# Patient Record
Sex: Female | Born: 1970 | Race: Black or African American | Hispanic: No | Marital: Single | Smoking: Never smoker
Health system: Southern US, Community
[De-identification: ages and names within clinical notes are randomized; demographics above are authoritative.]

## PROBLEM LIST (undated history)

## (undated) HISTORY — PX: HAND SURGERY: SHX662

## (undated) HISTORY — PX: CHOLECYSTECTOMY: SHX55

---

## 1999-06-02 ENCOUNTER — Inpatient Hospital Stay (HOSPITAL_COMMUNITY): Admission: EM | Admit: 1999-06-02 | Discharge: 1999-06-07 | Payer: Self-pay | Admitting: Emergency Medicine

## 1999-06-02 ENCOUNTER — Encounter (INDEPENDENT_AMBULATORY_CARE_PROVIDER_SITE_OTHER): Payer: Self-pay | Admitting: *Deleted

## 1999-06-02 ENCOUNTER — Encounter: Payer: Self-pay | Admitting: Emergency Medicine

## 1999-06-03 ENCOUNTER — Encounter: Payer: Self-pay | Admitting: Surgery

## 1999-06-04 ENCOUNTER — Encounter: Payer: Self-pay | Admitting: *Deleted

## 1999-06-05 ENCOUNTER — Encounter: Payer: Self-pay | Admitting: Surgery

## 1999-12-25 ENCOUNTER — Emergency Department (HOSPITAL_COMMUNITY): Admission: EM | Admit: 1999-12-25 | Discharge: 1999-12-25 | Payer: Self-pay | Admitting: Emergency Medicine

## 2003-04-01 ENCOUNTER — Emergency Department (HOSPITAL_COMMUNITY): Admission: EM | Admit: 2003-04-01 | Discharge: 2003-04-02 | Payer: Self-pay | Admitting: Emergency Medicine

## 2004-05-05 ENCOUNTER — Emergency Department (HOSPITAL_COMMUNITY): Admission: EM | Admit: 2004-05-05 | Discharge: 2004-05-05 | Payer: Self-pay | Admitting: *Deleted

## 2007-04-27 ENCOUNTER — Emergency Department (HOSPITAL_COMMUNITY): Admission: EM | Admit: 2007-04-27 | Discharge: 2007-04-27 | Payer: Self-pay | Admitting: *Deleted

## 2008-07-22 ENCOUNTER — Emergency Department (HOSPITAL_COMMUNITY): Admission: EM | Admit: 2008-07-22 | Discharge: 2008-07-22 | Payer: Self-pay | Admitting: Emergency Medicine

## 2008-09-23 ENCOUNTER — Emergency Department (HOSPITAL_COMMUNITY): Admission: EM | Admit: 2008-09-23 | Discharge: 2008-09-23 | Payer: Self-pay | Admitting: Emergency Medicine

## 2009-09-22 ENCOUNTER — Emergency Department (HOSPITAL_COMMUNITY): Admission: EM | Admit: 2009-09-22 | Discharge: 2009-09-22 | Payer: Self-pay | Admitting: Family Medicine

## 2009-11-16 ENCOUNTER — Emergency Department (HOSPITAL_COMMUNITY): Admission: EM | Admit: 2009-11-16 | Discharge: 2009-11-16 | Payer: Self-pay | Admitting: Emergency Medicine

## 2010-04-05 ENCOUNTER — Emergency Department (HOSPITAL_COMMUNITY)
Admission: EM | Admit: 2010-04-05 | Discharge: 2010-04-05 | Payer: Self-pay | Source: Home / Self Care | Admitting: Family Medicine

## 2010-07-30 LAB — BASIC METABOLIC PANEL
Chloride: 107 mEq/L (ref 96–112)
Creatinine, Ser: 0.71 mg/dL (ref 0.4–1.2)
GFR calc Af Amer: >60 mL/min (ref >60)
Sodium: 138 mEq/L (ref 135–145)

## 2010-07-30 LAB — CBC
Platelets: 299 10*3/uL (ref 150–400)
RDW: 13.7 % (ref 11.5–15.5)
WBC: 7.9 10*3/uL (ref 4.0–10.5)

## 2010-07-30 LAB — DIFFERENTIAL
Basophils Absolute: 0.1 10*3/uL (ref 0.0–0.1)
Eosinophils Relative: 1 % (ref 0–5)
Lymphocytes Relative: 18 % (ref 12–46)
Neutro Abs: 5.7 10*3/uL (ref 1.7–7.7)
Neutrophils Relative %: 73 % (ref 43–77)

## 2010-09-07 NOTE — Discharge Summary (Signed)
[place]. [facility]  Patient:    Lisa Woodward, Lisa Woodward                 MRN: [id number] Adm. Date:  [id number] Disc. Date: [id number] Attending:  Bonnetta Barry                           Discharge Summary  NO DICTATION DD:  08/13/99 TD:  08/14/99 Job: [id number]

## 2010-09-07 NOTE — Op Note (Signed)
[place]. [facility]  Patient:    Lisa Woodward, Lisa Woodward                 MRN: [id number] Proc. Date: 06/03/99 Adm. Date:  [id number] Attending:  Bonnetta Barry CC:         Llana Aliment. Randa Evens, M.D.             Florencia Reasons, M.D.                           Operative Report  PREOPERATIVE DIAGNOSIS:  Chronic cholecystitis with cholelithiasis.  POSTOPERATIVE DIAGNOSIS:  Chronic cholecystitis with cholelithiasis, retained common bile duct stone, perihepatic adhesions (Fitz-Hugh-Curtis syndrome).  OPERATION PERFORMED:  Laparoscopic cholecystectomy, intraoperative cholangiogram, exploration of common bile duct with Fogarty balloon catheter, lysis of perihepatic adhesions.  SURGEON:  Angelia Mould. Derrell Lolling, M.D.  FIRST ASSISTANT:  Thornton Park. Daphine Deutscher, M.D.  ANESTHESIA:  OPERATIVE INDICATIONS:  This is a 40 year old black female who has a long history of intermittent episodes of postprandial right upper quadrant pain and nausea. She was admitted to the hospital last night with an episode of severe epigastric pain. Ultrasound suggested a normal common bile duct.  Liver function tests showed elevation of SGOT and SGPT, but normal alkaline phosphatase and normal bilirubin. This morning, she was minimally symptomatic and her liver function tests were somewhat better, although not normal.  She was perioperatively by Florencia Reasons, M.D., who felt that she was at relatively low risks for common bile duct stone, and recommended that we proceed with cholecystectomy.  She is brought to the operating room for that surgery.  OPERATIVE FINDINGS:  The patient had chronically inflamed, thick walled gallbladder containing gallstones.  The cystic duct and common bile duct where, in fact, dilated.  Cholangiogram showed a retained _________ stone in the distal common bile duct, about 8-10 mm in diameter.  This could not be removed with the balloon catheter.  The  liver itself looked normal, although were extensive perihepatic adhesions from the dome of the liver to the diaphragm, suggesting prior inflammatory process.  The stomach and duodenum appeared healthy.  The small intestine and large intestine were grossly normal.  DESCRIPTION OF PROCEDURE:  Following the induction of general endotracheal anesthesia, the patients abdomen was prepped and draped in a sterile fashion.  transverse incision was made just below the umbilicus.  The fascia was incised n the midline and the abdominal cavity entered under direct vision. Pneumoperitoneum was created.  Video camera was inserted with visualization and findings as described above.  A 10 mm trocar was placed in the subxiphoid region, and two 5 mm trocars placed in the right mid abdomen.  We examined in the area around the umbilical trocar, and found that there were some adhesions tinting one loop of small bowel to the umbilicus.  This adhesion was taken down and we carefully inspected the small bowel, and found no evidence of injury.  We then turned our attention to the gallbladder and the liver.  We took down all of the perihepatic adhesions, and took photographs of this.  The gallbladder was placed on traction.  There were lots of adhesions to the duodenum to the gallbladder, and these were taken down very carefully.  We dissected out the very large cystic duct and very clearly identified the common bile duct.  The cystic  duct was secured with a metal clip and opened, and we inserted a balloon cholangiogram  catheter.  Cholangiogram was obtained and showed that there was an 8-10 mm _________ stone of the distal common bile duct, but no obstruction. The contrast did flow into the duodenum.  We felt no other filling defects.  The intrahepatic and extrahepatic bile ducts looked normal.  We removed the cholangiogram catheter and took a #5-French biliary Fogarty catheter and ran that down into the  duodenum several times, and withdrew this through the common bile  duct.  We felt that we removed the common bile duct stone, but we could not pull this out into the cystic duct.  We did not have the choledochoscope or the _____ available to [place], and so we did not feel that we should open the common bile duct laparoscopically.  We took a cholangiogram after running the biliary Fogarty, and it looked like the stone was up in the common hepatic duct.  We discussed the case with Fayrene Fearing L. Edwards, M.D. over the phone, and we all felt that it would be best to perform ERCP tomorrow morning to remove this common bile duct stone.  We then secured to the cystic duct with several metal clips and divided it.  We  isolated the anterior branch and the posterior branch of the cystic artery, secured these with metal clips and divided this.  The gallbladder was then dissected from its bed with electrocautery and removed through the umbilical port.  The operative field was copiously irrigated.  At the completion of the case, there was no bleeding and no bile leak whatsoever.  The trocars were removed under direct vision and there was no bleeding.  The pneumoperitoneum was released.  The fascia at the umbilicus was closed with 0 Vicryl sutures.  The skin incisions were closed with subcuticular stitches of 4-0 Vicryl and Steri-Strips.  Clean bandages were placed and the patient taken to the recovery room in stable  condition.  Estimated blood loss was about 10-15 cc.  Complications none. Sponge, needle, and instrument counts were correct. DD:  06/03/99 TD:  06/04/99 Job: [id number]

## 2010-09-07 NOTE — Discharge Summary (Signed)
[place]. [facility]  Patient:    Lisa Woodward, Lisa Woodward                 MRN: [id number] Adm. Date:  [id number] Disc. Date: [id number] Attending:  Bonnetta Barry CC:         Llana Aliment. Randa Evens, M.D.             Roosvelt Harps, M.D.                           Discharge Summary  FINAL DIAGNOSES: 1. Chronic cholecystitis with cholelithiasis. 2. Choledocholithiasis. 3. Perihepatic adhesions (Fitz-Hugh-Curtis syndrome).  OPERATIONS PERFORMED: 1. Laparoscopic cholecystectomy with intraoperative cholangiogram.  Fogarty balloon    catheter exploration of common bile duct, lysis of perihepatic    adhesions,    June 03, 1999. 2. Endoscopic retrograde cholangiopancreatography with sphincterotomy and    common bile duct stone removal.  HISTORY OF PRESENT ILLNESS:  This is a 40 year old black female who presented to the emergency department with a 24-hour history of epigastric pain, nausea, vomiting, and chills. She was evaluated by the emergency department physician. She was found to have a white blood cell count of 10,000 with a left shift and liver function tests showing an elevated SGOT of 738, elevated SGPT of 595, and a normal total bilirubin of 1.1.  Ultrasound was obtained which confirmed gallstones but no thickening of the gallbladder wall and no ductal dilatation.  General surgery was consulted and Dr. Darnell Level admitted her to the hospital.  PHYSICAL EXAMINATION:  GENERAL:  A 40 year old black female in minimal discomfort on a stretcher.  VITAL SIGNS:  Temperature 98.7, pulse 73, respirations 18, blood pressure 125/73.  HEENT:  Sclerae nonicteric.  CHEST:  Clear to auscultation.  HEART:  Regular rate and rhythm, no wheeze, no CVA tenderness.  ABDOMEN:  Soft.  Bowel sounds present, mild tenderness in the epigastrium. Mild Murphys sign present.  No palpable mass, no guarding, no rebound.  HOSPITAL COURSE:  Patient was admitted by Dr.  Darnell Level.  She was started on antibiotics and analgesics.  She was also seen by Dr. Carman Ching to discuss whether to do preoperative or postoperative ERCP.  Dr. Randa Evens and Dr. Bernette Redbird both felt that the patient should go ahead and have a laparoscopic cholecystectomy and they will stand by for ERCP postoperatively.  She was taken to the operating room on February 11.  I found that she had a chronically inflamed gallbladder and extensive adhesions of the liver to the diaphragm.  We took all of her perihepatic adhesions down, performed cholecystectomy and a cholangiogram.  She had what looked like a distal common ile duct stone.  We retrieved this with a balloon catheter unsuccessfully.  Postoperatively, she underwent ERCP and sphincterotomy and successful removal of common bile duct stones by Dr. Roosvelt Harps on February 12.  She did reasonably well post procedure but she had some fever and some nausea, nd some diarrhea for the next couple of days but that eventually resolved.  Her liver function tests got much better.  She resumed a diet and activity.  She was discharged on June 10, 1999.  At that time, she was afebrile, tolerating a diet, feeling well, and wanting to go home.  Her wounds were healing uneventfully.  She was asked to return to see me in the office in three weeks. DD:  07/20/99 TD:  07/21/99 Job: [id number]

## 2010-09-07 NOTE — H&P (Signed)
. [facility]  Patient:    Lisa Woodward, Lisa Woodward                 MRN: [id number] Adm. Date:  [id number] Attending:  Lorre Nick CC:         Osvaldo Human, M.D.             Velora Heckler, M.D.             James L. Randa Evens, M.D.                         History and Physical  REFERRING PHYSICIAN:  Osvaldo Human, M.D.  PRIMARY CARE PHYSICIAN:  [facility]  REASON FOR ADMISSION:  Abdominal pain, nausea, vomiting, liver function tests abnormalities.  HISTORY OF PRESENT ILLNESS:  The patient is a 40 year old black female who presents to the emergency department with 24-hour history of epigastric abdominal pain associated with nausea, vomiting, and chills.  The patient was evaluated in the  emergency department.  She was noted to have a white blood cell count in the upper range of normal at 10.0 with 83% segmented neutrophils.  The patient had liver function tests which showed an elevated SGOT of 738, an elevated SGPT of 595, with a normal total bilirubin of 1.1.  Right upper quadrant ultrasound was obtained,  which confirmed gallstones without acute inflammatory changes or ductal dilatation. General surgery is consulted at this time for further evaluation and management.  PAST MEDICAL HISTORY:  History of right hand surgery.  No other medical problems.  MEDICATIONS:  BC Powder, Pepcid AC, and Motrin over-the-counter for abdominal pain.  ALLERGIES:  None known.  SOCIAL HISTORY:  The patient does not smoke tobacco.  She uses marijuana on a regular basis.  She drinks alcohol in moderate amounts on a regular basis.  REVIEW OF SYSTEMS:  The patient notes intermittent abdominal pain, lasting for approximately one hour, occurring over the past year.  Otherwise systems review is negative.  She denies jaundice.  She denies any previous hepatobiliary disease.  She denies any history of hepatitis or pancreatitis.  She denies any  acholic stools.  PHYSICAL EXAMINATION:  GENERAL:  [age]-year-old thin black female in minimal discomfort on a stretcher in the emergency department.  VITAL SIGNS:  Temperature 98.7, pulse 73, respirations 18, blood pressure 125/73, O2 saturation is 98% on room air.  HEENT:  Normocephalic.  Sclerae are clear.  Mucous membranes are moist.  NECK:  Supple.  Without masses.  CHEST:  Clear to auscultation bilaterally.  CARDIAC:  Regular rate and rhythm with respiratory variation.  There is no costovertebral angle tenderness.  ABDOMEN:  Soft.  There are bowel sounds present.  There is mild tenderness in the epigastrium to palpation.  There is a mild Murphys sign present with deep inspiration.  There are no palpable masses.  There is no guarding.  There is no  rebound tenderness.  EXTREMITIES:  Nontender, without edema.  NEUROLOGIC:  Alert and oriented to person, place, and time, without focal neurologic deficit.  LABORATORY DATA:  Complete blood count shows a white count 10.0, hemoglobin 12.5, hematocrit 36.0%, platelet count 301,000.  Differential shows 83% neutrophils, 2% lymphocytes, 3% monocytes, 1% eosinophils.  Chemistry profile was notable for the following abnormalities:  SGOT 738, SGPT 595, total bilirubin of 1.1.  Alkaline  phosphatase was normal at 96, lipase was normal at 20.  Urinalysis is essentially benign except for a  slightly elevated urobilinogen of 4.0 mg/dl.  Radiographic studies:  Ultrasound of the abdomen documenting gallstones without  acute inflammatory changes or biliary dilatation.  IMPRESSION: 1. Cholelithiasis. 2. Rule out choledocholithiasis versus acute hepatitis. 3. Polysubstance abuse.  PLAN: 1. Admission to [facility]. 2. Intravenous hydrations. 3. Repeat laboratory studies. 4. Consultation with Dr. Carman Ching from gastroenterology for further    evaluation. DD:  06/02/99 TD:  06/03/99 Job: [id number]

## 2011-01-10 LAB — POCT PREGNANCY, URINE
Operator id: 146091
Preg Test, Ur: NEGATIVE

## 2011-01-10 LAB — COMPREHENSIVE METABOLIC PANEL
BUN: 7
CO2: 26
Chloride: 105
Creatinine, Ser: 0.75
GFR calc non Af Amer: >60
Total Bilirubin: 0.8

## 2011-01-10 LAB — URINALYSIS, ROUTINE W REFLEX MICROSCOPIC
Bilirubin Urine: NEGATIVE
Ketones, ur: NEGATIVE
Nitrite: NEGATIVE
Specific Gravity, Urine: 1.024
Urobilinogen, UA: 0.2

## 2011-01-10 LAB — URINE MICROSCOPIC-ADD ON

## 2011-01-10 LAB — CBC
HCT: 38.4
MCV: 95.3
RBC: 4.03
WBC: 7.9

## 2011-01-10 LAB — DIFFERENTIAL
Basophils Absolute: 0
Lymphocytes Relative: 25
Neutro Abs: 5.3

## 2011-01-10 LAB — LIPASE, BLOOD: Lipase: 17

## 2011-07-20 ENCOUNTER — Emergency Department (HOSPITAL_COMMUNITY)
Admission: EM | Admit: 2011-07-20 | Discharge: 2011-07-20 | Disposition: A | Discharge status: Home / Self Care | Payer: 59 | Source: Home / Self Care | Attending: Family Medicine | Admitting: Family Medicine

## 2011-07-20 ENCOUNTER — Encounter (HOSPITAL_COMMUNITY): Payer: Self-pay | Admitting: *Deleted

## 2011-07-20 DIAGNOSIS — R0789 Other chest pain: Secondary | ICD-10-CM

## 2011-07-20 DIAGNOSIS — K0889 Other specified disorders of teeth and supporting structures: Secondary | ICD-10-CM

## 2011-07-20 MED ORDER — AMOXICILLIN 500 MG PO CAPS: 500.0000 mg | ORAL_CAPSULE | Freq: Three times a day (TID) | ORAL | Status: AC

## 2011-07-20 MED ORDER — DICLOFENAC POTASSIUM 50 MG PO TABS: 50.0000 mg | ORAL_TABLET | Freq: Three times a day (TID) | ORAL | Status: AC

## 2011-07-20 NOTE — ED Provider Notes (Signed)
History     CSN: [id number]  Arrival date & time 07/20/11  1356   First MD Initiated Contact with Patient 07/20/11 1453      Chief Complaint  Patient presents with  . Dental Pain  . Nausea  . Chest Pain    (Consider location/radiation/quality/duration/timing/severity/associated sxs/prior treatment) Patient is a 41 y.o. female presenting with tooth pain and chest pain. The history is provided by the patient.  Dental PainThe primary symptoms include mouth pain. Primary symptoms do not include sore throat. The symptoms began more than 1 week ago. The symptoms are worsening. The symptoms occur constantly.  Additional symptoms include: dental sensitivity to temperature and gum swelling.   Chest Pain     History reviewed. No pertinent past medical history.  Past Surgical History  Procedure Date  . Cholecystectomy     History reviewed. No pertinent family history.  History  Substance Use Topics  . Smoking status: Never Smoker   . Smokeless tobacco: Not on file  . Alcohol Use: No    OB History    Grav Para Term Preterm Abortions TAB SAB Ect Mult Living                  Review of Systems  Constitutional: Negative.   HENT: Positive for dental problem. Negative for sore throat.   Cardiovascular: Positive for chest pain.    Allergies  Review of patient's allergies indicates no known allergies.  Home Medications   Current Outpatient Rx  Name Route Sig Dispense Refill  . BC HEADACHE POWDER PO Oral Take by mouth.    . AMOXICILLIN 500 MG PO CAPS Oral Take 1 capsule (500 mg total) by mouth 3 (three) times daily. 30 capsule 0  . DICLOFENAC POTASSIUM 50 MG PO TABS Oral Take 1 tablet (50 mg total) by mouth 3 (three) times daily. 30 tablet 0    BP 151/90  Pulse 74  Temp(Src) 98.6 F (37 C) (Oral)  Resp 18  SpO2 99%  LMP 07/06/2011  Physical Exam  Nursing note and vitals reviewed. Constitutional: She appears well-developed and well-nourished.  HENT:  Head:  Normocephalic.  Right Ear: External ear normal.  Left Ear: External ear normal.  Mouth/Throat:    Neck: Normal range of motion. Neck supple.  Cardiovascular: Normal rate, regular rhythm and normal heart sounds.   Pulmonary/Chest: Breath sounds normal. She exhibits tenderness.    ED Course  Procedures (including critical care time)  Labs Reviewed - No data to display No results found.   1. Pain, dental   2. Musculoskeletal chest pain       MDM          Linna Hoff, MD 07/20/11 1600

## 2011-07-20 NOTE — ED Notes (Signed)
Pt with c/o left lower tooth ache x 2 weeks - onset of intermittent pain left side chest two days after toothache - denies pain at this time chest area

## 2012-01-08 ENCOUNTER — Encounter (HOSPITAL_COMMUNITY): Payer: Self-pay | Admitting: *Deleted

## 2012-01-08 ENCOUNTER — Emergency Department (HOSPITAL_COMMUNITY)
Admission: EM | Admit: 2012-01-08 | Discharge: 2012-01-08 | Disposition: A | Discharge status: Home / Self Care | Payer: 59 | Source: Home / Self Care | Attending: Emergency Medicine | Admitting: Emergency Medicine

## 2012-01-08 DIAGNOSIS — R110 Nausea: Secondary | ICD-10-CM

## 2012-01-08 LAB — POCT URINALYSIS DIP (DEVICE)
Bilirubin Urine: NEGATIVE
Ketones, ur: NEGATIVE mg/dL
Leukocytes, UA: NEGATIVE
Nitrite: NEGATIVE
Protein, ur: NEGATIVE mg/dL

## 2012-01-08 LAB — POCT I-STAT, CHEM 8
Glucose, Bld: 89 mg/dL (ref 70–99)
HCT: 42 % (ref 36.0–46.0)
Hemoglobin: 14.3 g/dL (ref 12.0–15.0)
Potassium: 3.7 mEq/L (ref 3.5–5.1)
Sodium: 142 mEq/L (ref 135–145)

## 2012-01-08 MED ORDER — ONDANSETRON HCL 4 MG PO TABS: 4.0000 mg | ORAL_TABLET | Freq: Four times a day (QID) | ORAL | Status: DC

## 2012-01-08 NOTE — ED Provider Notes (Addendum)
History     CSN: [id number]  Arrival date & time 01/08/12  1805   First MD Initiated Contact with Patient 01/08/12 1822      Chief Complaint  Patient presents with  . Nausea    (Consider location/radiation/quality/duration/timing/severity/associated sxs/prior treatment) HPI Comments: Patient describes a for about a week she's been experiencing intermittent episodes of nausea, hot flashes at night and sometimes discomfort in her chest (patient points to the substernal region). Denies any, shortness of breath, palpitations. Upon evaluation patient seemed to be feeling okay he is asymptomatic denies any chest pain its swollen. No abdominal pain does feel some distention. Patient denies any other constitutional symptoms such as fevers, generalized malaise changes in appetite or unintentional weight loss.  Patient is a 41 y.o. female presenting with cough and chest pain. The history is provided by the patient.  Cough This is a recurrent problem. The problem has been gradually improving. The cough is non-productive. Associated symptoms include chest pain, chills and sweats. Pertinent negatives include no weight loss, no headaches, no sore throat, no myalgias, no shortness of breath and no wheezing. She is not a smoker. Her past medical history does not include bronchitis, pneumonia, COPD, emphysema or asthma.  Chest Pain Chest pain occurs intermittently. The chest pain is unchanged. At its most intense, the pain is at 4/10. The pain is currently at 0/10. The quality of the pain is described as brief. Chest pain is worsened by deep breathing. Primary symptoms include nausea. Pertinent negatives for primary symptoms include no fever, no fatigue, no shortness of breath, no cough, no wheezing, no palpitations, no abdominal pain, no vomiting and no dizziness.  Associated symptoms include diaphoresis.  Pertinent negatives for associated symptoms include no numbness.  Pertinent negatives for past medical  history include no seizures.     History reviewed. No pertinent past medical history.  Past Surgical History  Procedure Date  . Cholecystectomy     Family History  Problem Relation Age of Onset  . Family history unknown: Yes    History  Substance Use Topics  . Smoking status: Never Smoker   . Smokeless tobacco: Not on file  . Alcohol Use: No    OB History    Grav Para Term Preterm Abortions TAB SAB Ect Mult Living                  Review of Systems  Constitutional: Positive for chills and diaphoresis. Negative for fever, weight loss, activity change, appetite change, fatigue and unexpected weight change.  HENT: Negative for sore throat.   Respiratory: Negative for apnea, cough, chest tightness, shortness of breath and wheezing.   Cardiovascular: Positive for chest pain. Negative for palpitations.  Gastrointestinal: Positive for nausea and diarrhea. Negative for vomiting, abdominal pain, constipation, blood in stool, abdominal distention, anal bleeding and rectal pain.  Genitourinary: Negative for dysuria, frequency, enuresis and pelvic pain.  Musculoskeletal: Negative for myalgias, back pain, joint swelling, arthralgias and gait problem.  Skin: Negative for pallor, rash and wound.  Neurological: Negative for dizziness, seizures, speech difficulty, numbness and headaches.    Allergies  Review of patient's allergies indicates no known allergies.  Home Medications   Current Outpatient Rx  Name Route Sig Dispense Refill  . BC HEADACHE POWDER PO Oral Take by mouth.    . DICLOFENAC POTASSIUM 50 MG PO TABS Oral Take 1 tablet (50 mg total) by mouth 3 (three) times daily. 30 tablet 0  . ONDANSETRON HCL 4 MG PO TABS  Oral Take 1 tablet (4 mg total) by mouth every 6 (six) hours. 12 tablet 0    BP 141/95  Pulse 63  Temp 98.6 F (37 C) (Oral)  Resp 18  SpO2 99%  LMP 01/01/2012  Physical Exam  Nursing note and vitals reviewed. Constitutional: Vital signs are normal.  She appears well-developed and well-nourished.  Non-toxic appearance. She does not have a sickly appearance. She does not appear ill. No distress.  HENT:  Head: Normocephalic.  Eyes: Conjunctivae normal are normal.  Neck: Neck supple.  Cardiovascular: Normal rate.  Exam reveals no gallop and no friction rub.   No murmur heard. Abdominal: Soft. Normal appearance. She exhibits no distension and no mass. There is no hepatosplenomegaly or hepatomegaly. There is no tenderness. There is no rigidity, no rebound, no guarding, no CVA tenderness, no tenderness at McBurney's point and negative Murphy's sign.  Musculoskeletal: She exhibits no tenderness.  Neurological: She is alert.  Skin: No rash noted. No erythema.    ED Course  Procedures (including critical care time)   Labs Reviewed  POCT I-STAT, CHEM 8  POCT URINALYSIS DIP (DEVICE)  TSH   No results found.   1. Nausea     EKG sinus bradycardia to switch her from 55-60 beats per minute. No interval QRS abnormality. No signs of ischemia  MDM  Patient presents urgent care at this point asymptomatic describing symptomatology that includes nausea diarrhea. Discussed with patient her lab results and pending TSH. Patient had a normal cardiovascular respiratory and abdominal exam. Have discussed with her today will be important to followup with primary care Dr. as I have found no etiology for her symptoms and have advised her that there many illnesses or disorders can present in a similar fashion with baked symptomatology such as this. Patient agrees with followup care with primary care Dr. to continue the diagnostic workup. If her symptoms were to persist. We also discussed what symptoms will require further evaluation in the emergency department.        Jimmie Molly, MD 01/08/12 Barry Brunner  Jimmie Molly, MD 01/08/12 1610  Jimmie Molly, MD 01/08/12 2026

## 2012-01-08 NOTE — ED Notes (Signed)
Pt reports nausea, hot flashes at night, and chest soreness on and off for the past week with intermittent diarrhea

## 2013-07-08 ENCOUNTER — Emergency Department (HOSPITAL_COMMUNITY)
Admission: EM | Admit: 2013-07-08 | Discharge: 2013-07-08 | Disposition: A | Discharge status: Home / Self Care | Payer: 59 | Attending: Emergency Medicine | Admitting: Emergency Medicine

## 2013-07-08 ENCOUNTER — Encounter (HOSPITAL_COMMUNITY): Payer: Self-pay | Admitting: Emergency Medicine

## 2013-07-08 DIAGNOSIS — M25561 Pain in right knee: Secondary | ICD-10-CM

## 2013-07-08 DIAGNOSIS — Z79899 Other long term (current) drug therapy: Secondary | ICD-10-CM | POA: Insufficient documentation

## 2013-07-08 DIAGNOSIS — M25569 Pain in unspecified knee: Secondary | ICD-10-CM | POA: Insufficient documentation

## 2013-07-08 DIAGNOSIS — J029 Acute pharyngitis, unspecified: Secondary | ICD-10-CM | POA: Insufficient documentation

## 2013-07-08 DIAGNOSIS — J3489 Other specified disorders of nose and nasal sinuses: Secondary | ICD-10-CM | POA: Insufficient documentation

## 2013-07-08 MED ORDER — IBUPROFEN 800 MG PO TABS: 800.0000 mg | ORAL_TABLET | Freq: Three times a day (TID) | ORAL | Status: DC

## 2013-07-08 NOTE — ED Provider Notes (Signed)
CSN: [id number]     Arrival date & time 07/08/13  1304 History  This chart was scribed for non-physician practitioner, Sharilyn Sites, PA-C working with Juliet Rude. Rubin Payor, MD by Greggory Stallion, ED scribe. This patient was seen in room TR10C/TR10C and the patient's care was started at 2:13 PM.   Chief Complaint  Patient presents with  . Sore Throat   The history is provided by the patient. No language interpreter was used.   HPI Comments: Lisa Woodward is a 43 y.o. female who presents to the Emergency Department complaining of gradual onset, constant sore throat that started 4 days ago. Pt has associated sneezing and mild nasal congestion.  Hx of allergy problems but not currently on any meds.  No cough, fevers, or chills.  Grandson recently sick with URI.  Pt also complains of right knee pain x 3 days.  No injury, trauma, or falls.  States pain is worse in the morning and improves throughout the day.  Described as a generalized ache.  No prior knee injuries or surgeries.  Ambulating without difficulty.  No meds taken PTA.   History reviewed. No pertinent past medical history. Past Surgical History  Procedure Laterality Date  . Cholecystectomy     No family history on file. History  Substance Use Topics  . Smoking status: Never Smoker   . Smokeless tobacco: Not on file  . Alcohol Use: No   OB History   Grav Para Term Preterm Abortions TAB SAB Ect Mult Living                 Review of Systems  HENT: Positive for sore throat. Negative for sneezing.   Respiratory: Negative for cough.   Musculoskeletal: Positive for arthralgias.  All other systems reviewed and are negative.   Allergies  Review of patient's allergies indicates no known allergies.  Home Medications   Current Outpatient Rx  Name  Route  Sig  Dispense  Refill  . Aspirin-Salicylamide-Caffeine (BC HEADACHE POWDER PO)   Oral   Take by mouth.         . ondansetron (ZOFRAN) 4 MG tablet   Oral   Take 1  tablet (4 mg total) by mouth every 6 (six) hours.   12 tablet   0    BP 133/80  Pulse 88  Temp(Src) 98.1 F (36.7 C)  Resp 16  SpO2 100%  Physical Exam  Nursing note and vitals reviewed. Constitutional: She is oriented to person, place, and time. She appears well-developed and well-nourished. No distress.  HENT:  Head: Normocephalic and atraumatic.  Right Ear: Tympanic membrane and ear canal normal.  Left Ear: Tympanic membrane and ear canal normal.  Nose: Nose normal. Right sinus exhibits no maxillary sinus tenderness and no frontal sinus tenderness. Left sinus exhibits no maxillary sinus tenderness and no frontal sinus tenderness.  Mouth/Throat: Uvula is midline, oropharynx is clear and moist and mucous membranes are normal. No oropharyngeal exudate, posterior oropharyngeal edema, posterior oropharyngeal erythema or tonsillar abscesses.  Tonsils normal in appearance bilaterally without exudate, uvula midline, no peritonsillar abscess, handling secretions appropriately without difficulty  Eyes: Conjunctivae and EOM are normal. Pupils are equal, round, and reactive to light.  Neck: Normal range of motion.  Cardiovascular: Normal rate, regular rhythm and normal heart sounds.   Pulmonary/Chest: Effort normal and breath sounds normal. No respiratory distress. She has no wheezes.  Musculoskeletal: Normal range of motion. She exhibits no edema.  Right knee non tender. No gross deformities or  effusion. Crepitus with flexion and extension. Distal sensation intact. Ambulating unassisted without difficulty.  Neurological: She is alert and oriented to person, place, and time.  Skin: Skin is warm and dry. She is not diaphoretic.  Psychiatric: She has a normal mood and affect.    ED Course  Procedures (including critical care time)  DIAGNOSTIC STUDIES: Oxygen Saturation is 100% on RA, normal by my interpretation.    COORDINATION OF CARE: 2:16 PM-Discussed treatment plan which includes  allergy medicine and an anti-inflammatory with pt at bedside and pt agreed to plan.   Labs Review Labs Reviewed - No data to display Imaging Review No results found.   EKG Interpretation None      MDM   Final diagnoses:  Allergic pharyngitis  Knee pain, right   Symptoms and physical exam findings consistent with allergic pharyngitis. Patient instructed to start taking over-the-counter allergy medicine. Right knee pain without gross deformity or known injury. On exam she does have crepitus with flexion and extension, likely osteoarthritis. Patient will be started on anti-inflammatories. She does not have a current PCP at this time, I have encouraged her to establish care with one area, resource guide given. Discussed plan with patient, she acknowledged understanding and agreed with plan of care.  I personally performed the services described in this documentation, which was scribed in my presence. The recorded information has been reviewed and is accurate  Garlon HatchetLisa M Sanders, PA-C 07/08/13 1529

## 2013-07-08 NOTE — Discharge Instructions (Signed)
Take the prescribed medication as directed. Recommend start taking over the counter allergy medicine such as zyrtec, claritin, allegra, etc to help control allergy sx. Follow-up with a primary care physician in the area.  Resource guide attached to help with this. Return to the ED for new or worsening symptoms.   Emergency Department Resource Guide 1) Find a Doctor and Pay Out of Pocket Although you won't have to find out who is covered by your insurance plan, it is a good idea to ask around and get recommendations. You will then need to call the office and see if the doctor you have chosen will accept you as a new patient and what types of options they offer for patients who are self-pay. Some doctors offer discounts or will set up payment plans for their patients who do not have insurance, but you will need to ask so you aren't surprised when you get to your appointment.  2) Contact Your Local Health Department Not all health departments have doctors that can see patients for sick visits, but many do, so it is worth a call to see if yours does. If you don't know where your local health department is, you can check in your phone book. The CDC also has a tool to help you locate your state's health department, and many state websites also have listings of all of their local health departments.  3) Find a Walk-in Clinic If your illness is not likely to be very severe or complicated, you may want to try a walk in clinic. These are popping up all over the country in pharmacies, drugstores, and shopping centers. They're usually staffed by nurse practitioners or physician assistants that have been trained to treat common illnesses and complaints. They're usually fairly quick and inexpensive. However, if you have serious medical issues or chronic medical problems, these are probably not your best option.  No Primary Care Doctor: - Call Health Connect at  [phone] - they can help you locate a primary care  doctor that  accepts your insurance, provides certain services, etc. - Physician Referral Service- [phone]  Chronic Pain Problems: Organization         Address  Phone   Notes  [facility]  [phone] Patients need to be referred by their primary care doctor.   Medication Assistance: Organization         Address  Phone   Notes  [facility] Medication [facility] [address] [place], [place] [address] [phone] --Must be a resident of [place] -- Must have NO insurance coverage whatsoever (no Medicaid/ Medicare, etc.) -- The pt. MUST have a primary care doctor that directs their care regularly and follows them in the community   MedAssist  [phone]   [employer]  [phone]    Agencies that provide inexpensive medical care: Organization         Address  Phone   Notes  [facility]  [phone]   [facility]    [phone]   [facility] [address] [place], [place] [address] [phone]   [facility] [address], [place] [phone]   [facility]anned [facility]    [phone]   [facility]    [phone]   [facility] and [facility]  [address], [place] Phone:  [phone], Fax:  [phone] Hours of Operation:  9 am - 6 pm, M-F.  Also accepts Medicaid/Medicare and self-pay.  [facility] [place], [address], [place] Phone: [phone], Fax: [phone]. Hours of Operation:  8:30 am - 5:30 pm, M-F.  Also accepts Medicaid and self-pay.  [facility] [place] [address] [place] Phone: [phone]   [place], [place], [place] [phone], Ext. [phone] Mondays & Thursdays: 7-9 AM.  First 15 patients are seen on a first come, first serve basis.    [place]  Providers:  Organization         Address  Phone   Notes  [facility] [address], [place] [phone] Also accepts self-pay patients.  [facility] [address], [place]  [phone]   [place], [address], [place] [phone]   [facility] [address] [place] [phone]   Lucianne Lei [address], [place]   [phone] Only accepts [place] Access [place] patients after they have their name applied to their card.   Self-Pay (no insurance) in [facility]:  Organization         Address  Phone   Notes  Sickle Cell Patients, [facility] [place] [phone]   [facility] [place] [phone]   [facility]  [place], [place],  [phone]   [facility]/Dr. Osei-Bonsu  [address] [place] or [address], [place] [phone] Phone number for both [place] and [place] locations is the same.  [facility] [address], [place] [phone]   [facility] [address] [place] or [address] [phone]   [facility] [address], [place] [phone], phone; [phone], fax Sees patients 1st and 3rd Saturday of every month.  Must not qualify for public or private insurance (i.e. Medicaid, Medicare, [place] Health Choice, Veterans' Benefits)  Household income should be no more than 200% of the poverty level The clinic cannot treat you if you are pregnant or think you are pregnant  Sexually transmitted diseases are not treated at the clinic.    Dental Care: Organization         Address  Phone  Notes  [facility] Department of [facility] [place] [phone] Accepts children up to age 12 who are enrolled in [place] or [place]; pregnant women with a Medicaid card; and children who have applied for Medicaid or [place] Health Choice, but were declined, whose parents can pay a reduced fee at time of service.  [facility] Department of [facility]  [address], [place] [phone] Accepts children up to age 24 who are enrolled in [place] or [place]; pregnant women with a Medicaid card; and children who have applied for Medicaid or [place] Health Choice, but were declined, whose parents can pay a reduced fee at time of service.  [facility] Adult [facility] Access PROGRAM  [place] [phone] Patients are seen by appointment only. Walk-ins are not accepted. [place] will see patients 18 years  of age and older. Monday - Tuesday (8am-5pm) Most Wednesdays (8:30-5pm) $30 per visit, cash only  [facility] Adult Dental Access PROGRAM  [address], [facility] [phone] Patients are seen by appointment only. Walk-ins are not accepted. [place] will see patients 43 years of age and older. One Wednesday Evening (Monthly: Volunteer Based).  $30 per visit, cash only  [place]  [phone] for adults; Children under age 81, call Graduate Pediatric Dentistry at [phone]. Children aged 70-14, please call [phone] to request a pediatric application.  Dental services are provided in all areas of dental care including fillings, crowns and bridges, complete and partial dentures, implants, gum treatment, root canals, and extractions. Preventive care is also provided. Treatment is provided to both adults and children. Patients are selected via a lottery and there is often a waiting list.   [facility] [address], [place]  [phone] [email or web address]   [facility]  [address] [place], [place] [phone], Ext. [phone] Second and Fourth Thursday of each month, opens at 6:30 AM; Clinic ends at 9 AM.  Patients are seen on a first-come first-served basis, and a limited number are seen during each clinic.   [facility]  [address] Hillard Danker [place], [place] [phone]   Eligibility Requirements You must have lived in [place], [place], or [place] counties for at least the last three months.   You cannot be eligible for state or federal sponsored [employer], including [employer], [place], or [employer].   You generally cannot be eligible for healthcare insurance through your employer.    How to apply: Eligibility screenings are held every Tuesday and Wednesday afternoon from 1:00 pm until 4:00 pm. You do not need an appointment for the interview!  [facility] [address], [place], [place]   [place]  [place] Department  [place]  [phone]    Behavioral Health Resources in the Community: Intensive Outpatient Programs Organization         Address  Phone  Notes  [place]. [address] [place], [place] [phone]   [facility] [address] [place], [place]   ADS: Alcohol & Drug Svcs [address] [place], [place]   [place] [address],  [place], [place] or [phone]   Substance Abuse Resources Organization         Address  Phone  Notes  Alcohol and Drug Services  [phone]   [place]  [phone]   The [place]   Chinita Pester  [phone]   Residential & Outpatient Substance Abuse Program  [phone]   Psychological Services Organization         Address  Phone  Notes  [facility] [place]  [place]  [phone]   [place] [address] [place] or [phone]    Mobile Crisis Teams Organization         Address  Phone  Notes  [facility], Mobile Crisis Care Unit  [phone]   [facility]  [address] [place], [place]   [facility] [address] [place] [phone]    Self-Help/Support Groups Organization  Address  Phone             Notes  [place]. of [place] - variety of support groups  [place] Call for more information  Narcotics Anonymous (NA), [facility]aring [facility] [address], [employer] [place]  2 meetings at this location   Special educational needs teacher         Address  Phone  Notes  [facility] Residential Treatment [place],    [place]  [phone]   [facility]  [address], [place] 329924, [place], [place]   [place], [place] [phone] Admissions: 8am-3pm M-F  Incentives Substance [place] [address],    [place], [place] [phone]   [facility] [address] [place], [place], [place]   [facility] [address],  [place], [place]   [facility] Programs - Intensive Outpatient [address], [place], [place]   [facility].,  [place], [place] [phone] or [phone]   [facility] Services (RTS) [address] [place], [place] Accepts Medicaid  [facility] [address]  [place] [phone] Substance Abuse/Addiction Treatment   [facility] Organization         Address  Phone  Notes  [facility]  [phone]   Domenic Schwab, PhD [address] Arlis Porta [place], [place]   [phone] or [phone]    [place], [place] [phone]   [facility] [address], [place], [place] [phone] Insurance/Medicaid/sponsorship through [facility] and Families [address], [address] [place]                                    [place], [place] [phone] [place] [address], [place] [phone]    Dr. Adele Schilder  [phone]   [facility] Dept. 1) [address], [place] 2) [place] 3)  [place] [address], [place] [phone]  [phone]   [place] [phone] or [phone] (After Hours)

## 2013-07-08 NOTE — ED Notes (Signed)
Sore throat, since Sunday  rt leg pain ( denies injury)  Also since sunday

## 2013-07-12 NOTE — ED Provider Notes (Signed)
Medical screening examination/treatment/procedure(s) were performed by non-physician practitioner and as supervising physician I was immediately available for consultation/collaboration.   EKG Interpretation None       Juliet RudeNathan R. Rubin PayorPickering, MD 07/12/13 [phone]

## 2014-07-04 ENCOUNTER — Emergency Department (INDEPENDENT_AMBULATORY_CARE_PROVIDER_SITE_OTHER)
Admission: EM | Admit: 2014-07-04 | Discharge: 2014-07-04 | Disposition: A | Discharge status: Home / Self Care | Payer: Self-pay | Source: Home / Self Care | Attending: Family Medicine | Admitting: Family Medicine

## 2014-07-04 ENCOUNTER — Encounter (HOSPITAL_COMMUNITY): Payer: Self-pay | Admitting: Emergency Medicine

## 2014-07-04 DIAGNOSIS — G44209 Tension-type headache, unspecified, not intractable: Secondary | ICD-10-CM

## 2014-07-04 DIAGNOSIS — J029 Acute pharyngitis, unspecified: Secondary | ICD-10-CM

## 2014-07-04 DIAGNOSIS — Z720 Tobacco use: Secondary | ICD-10-CM

## 2014-07-04 DIAGNOSIS — F17200 Nicotine dependence, unspecified, uncomplicated: Secondary | ICD-10-CM

## 2014-07-04 DIAGNOSIS — J9801 Acute bronchospasm: Secondary | ICD-10-CM

## 2014-07-04 DIAGNOSIS — B349 Viral infection, unspecified: Secondary | ICD-10-CM

## 2014-07-04 LAB — POCT RAPID STREP A: Streptococcus, Group A Screen (Direct): NEGATIVE

## 2014-07-04 MED ORDER — DICLOFENAC POTASSIUM 50 MG PO TABS: 50.0000 mg | ORAL_TABLET | Freq: Three times a day (TID) | ORAL | Status: DC

## 2014-07-04 MED ORDER — ALBUTEROL SULFATE HFA 108 (90 BASE) MCG/ACT IN AERS: 2.0000 | INHALATION_SPRAY | RESPIRATORY_TRACT | Status: DC | PRN

## 2014-07-04 NOTE — Discharge Instructions (Signed)
Pharyngitis Pharyngitis is a sore throat (pharynx). There is redness, pain, and swelling of your throat. HOME CARE   Drink enough fluids to keep your pee (urine) clear or pale yellow.  Only take medicine as told by your doctor.  You may get sick again if you do not take medicine as told. Finish your medicines, even if you start to feel better.  Do not take aspirin.  Rest.  Rinse your mouth (gargle) with salt water ( tsp of salt per 1 qt of water) every 1-2 hours. This will help the pain.  If you are not at risk for choking, you can suck on hard candy or sore throat lozenges. GET HELP IF:  You have large, tender lumps on your neck.  You have a rash.  You cough up green, yellow-brown, or bloody spit. GET HELP RIGHT AWAY IF:   You have a stiff neck.  You drool or cannot swallow liquids.  You throw up (vomit) or are not able to keep medicine or liquids down.  You have very bad pain that does not go away with medicine.  You have problems breathing (not from a stuffy nose). MAKE SURE YOU:   Understand these instructions.  Will watch your condition.  Will get help right away if you are not doing well or get worse. Document Released: 09/25/2007 Document Revised: 01/27/2013 Document Reviewed: 12/14/2012 [facility] Patient Information 2015 [place], [place]. This information is not intended to replace advice given to you by your health care provider. Make sure you discuss any questions you have with your health care provider.  Viral Infections Continue the NyQuil. And start taking the [place] 3 times a day with food. Drink lots of fluids. Apply heat to the back of the neck. Cepacol lozenges for sore throat Stop smoking A viral infection can be caused by different types of viruses.Most viral infections are not serious and resolve on their own. However, some infections may cause severe symptoms and may lead to further complications. SYMPTOMS Viruses can frequently  cause:  Minor sore throat.  Aches and pains.  Headaches.  Runny nose.  Different types of rashes.  Watery eyes.  Tiredness.  Cough.  Loss of appetite.  Gastrointestinal infections, resulting in nausea, vomiting, and diarrhea. These symptoms do not respond to antibiotics because the infection is not caused by bacteria. However, you might catch a bacterial infection following the viral infection. This is sometimes called a "superinfection." Symptoms of such a bacterial infection may include:  Worsening sore throat with pus and difficulty swallowing.  Swollen neck glands.  Chills and a high or persistent fever.  Severe headache.  Tenderness over the sinuses.  Persistent overall ill feeling (malaise), muscle aches, and tiredness (fatigue).  Persistent cough.  Yellow, green, or brown mucus production with coughing. HOME CARE INSTRUCTIONS   Only take over-the-counter or prescription medicines for pain, discomfort, diarrhea, or fever as directed by your caregiver.  Drink enough water and fluids to keep your urine clear or pale yellow. Sports drinks can provide valuable electrolytes, sugars, and hydration.  Get plenty of rest and maintain proper nutrition. Soups and broths with crackers or rice are fine. SEEK IMMEDIATE MEDICAL CARE IF:   You have severe headaches, shortness of breath, chest pain, neck pain, or an unusual rash.  You have uncontrolled vomiting, diarrhea, or you are unable to keep down fluids.  You or your child has an oral temperature above 102 F (38.9 C), not controlled by medicine.  Your baby is older than  3 months with a rectal temperature of 102 F (38.9 C) or higher.  Your baby is 493 months old or younger with a rectal temperature of 100.4 F (38 C) or higher. MAKE SURE YOU:   Understand these instructions.  Will watch your condition.  Will get help right away if you are not doing well or get worse. Document Released: 01/16/2005 Document  Revised: 07/01/2011 Document Reviewed: 08/13/2010 [facility] Patient Information 2015 [place]. This information is not intended to replace advice given to you by your health care provider. Make sure you discuss any questions you have with your health care provider.

## 2014-07-04 NOTE — ED Notes (Signed)
Pt states that she has had a headache and sore throat since 06/30/2014

## 2014-07-04 NOTE — ED Provider Notes (Signed)
CSN: [id number]     Arrival date & time 07/04/14  0802 History   First MD Initiated Contact with Patient 07/04/14 [phone]     Chief Complaint  Patient presents with  . Headache  . Sore Throat   (Consider location/radiation/quality/duration/timing/severity/associated sxs/prior Treatment) HPI Comments: 44 year old females complaining of sore throat, headache tickly in the posterior neck and occiput, eyes hurt, cough and PND. Symptoms began about 4 days ago. Denies fever occasionally has chills. She is a smoker. She has been taking NyQuil and other OTC cold medicines.   History reviewed. No pertinent past medical history. Past Surgical History  Procedure Laterality Date  . Cholecystectomy     History reviewed. No pertinent family history. History  Substance Use Topics  . Smoking status: Never Smoker   . Smokeless tobacco: Not on file  . Alcohol Use: No   OB History    No data available     Review of Systems  Constitutional: Positive for activity change. Negative for fever, chills, appetite change and fatigue.  HENT: Positive for congestion, postnasal drip and sore throat. Negative for facial swelling and rhinorrhea.   Eyes: Negative.   Respiratory: Negative.   Cardiovascular: Negative.   Gastrointestinal: Negative.   Genitourinary: Negative.   Musculoskeletal: Negative for neck pain and neck stiffness.  Skin: Negative for pallor and rash.  Neurological: Negative.     Allergies  Review of patient's allergies indicates no known allergies.  Home Medications   Prior to Admission medications   Medication Sig Start Date End Date Taking? Authorizing Provider  albuterol (PROVENTIL HFA;VENTOLIN HFA) 108 (90 BASE) MCG/ACT inhaler Inhale 2 puffs into the lungs every 4 (four) hours as needed for wheezing or shortness of breath. 07/04/14   Hayden Rasmussenavid Chaise Mahabir, NP  Aspirin-Salicylamide-Caffeine (BC HEADACHE POWDER PO) Take 1 packet by mouth daily as needed (pain).     Historical Provider, MD   bismuth subsalicylate (PEPTO BISMOL) 262 MG/15ML suspension Take 30 mLs by mouth every 6 (six) hours as needed for indigestion.    Historical Provider, MD  diclofenac (CATAFLAM) 50 MG tablet Take 1 tablet (50 mg total) by mouth 3 (three) times daily. One tablet TID with food prn pain. 07/04/14   Hayden Rasmussenavid Alyx Gee, NP  ibuprofen (ADVIL,MOTRIN) 800 MG tablet Take 1 tablet (800 mg total) by mouth 3 (three) times daily. 07/08/13   Garlon HatchetLisa M Sanders, PA-C  Omega-3 Fatty Acids (FISH OIL PO) Take 1 capsule by mouth daily.    Historical Provider, MD   BP 137/91 mmHg  Pulse 66  Temp(Src) 98.1 F (36.7 C)  Resp 16  SpO2 100% Physical Exam  Constitutional: She is oriented to person, place, and time. She appears well-developed and well-nourished. No distress.  HENT:  Bilateral TMs are normal Oropharynx with minor erythema, no swelling or exudates.  Eyes: EOM are normal.  Neck: Normal range of motion. Neck supple.  Cardiovascular: Normal rate, regular rhythm and normal heart sounds.   Pulmonary/Chest:  Mildly prolonged expiratory phase, rare wheeze bilaterally and coarseness with forced cough.  Musculoskeletal: Normal range of motion. She exhibits no edema.  Lymphadenopathy:    She has no cervical adenopathy.  Neurological: She is alert and oriented to person, place, and time.  Skin: Skin is warm and dry.  Psychiatric: She has a normal mood and affect.  Nursing note and vitals reviewed.   ED Course  Procedures (including critical care time) Labs Review Labs Reviewed  POCT RAPID STREP A ([facility] ONLY)    Imaging  Review No results found. Results for orders placed or performed during the hospital encounter of 07/04/14  POCT rapid strep A [facility])  Result Value Ref Range   Streptococcus, Group A Screen (Direct) NEGATIVE NEGATIVE     MDM   1. Viral illness   2. Tension headache   3. Pharyngitis   4. Bronchospasm   5. Current smoker    Continue the NyQuil. And start taking the  Cataflam 3 times a day with food. Drink lots of fluids. Apply heat to the back of the neck. Cepacol lozenges for sore throat Stop smoking Albuterol HFA as dir    Hayden Rasmussen, NP 07/04/14 [phone]

## 2014-07-06 LAB — CULTURE, GROUP A STREP: STREP A CULTURE: NEGATIVE

## 2015-11-02 ENCOUNTER — Encounter (HOSPITAL_COMMUNITY): Payer: Self-pay | Admitting: Emergency Medicine

## 2015-11-02 ENCOUNTER — Ambulatory Visit (HOSPITAL_COMMUNITY)
Admission: EM | Admit: 2015-11-02 | Discharge: 2015-11-02 | Disposition: A | Discharge status: Home / Self Care | Payer: Self-pay | Attending: Family Medicine | Admitting: Family Medicine

## 2015-11-02 DIAGNOSIS — M79601 Pain in right arm: Secondary | ICD-10-CM

## 2015-11-02 DIAGNOSIS — R109 Unspecified abdominal pain: Secondary | ICD-10-CM

## 2015-11-02 DIAGNOSIS — H6501 Acute serous otitis media, right ear: Secondary | ICD-10-CM

## 2015-11-02 DIAGNOSIS — M5441 Lumbago with sciatica, right side: Secondary | ICD-10-CM

## 2015-11-02 LAB — COMPREHENSIVE METABOLIC PANEL
ALT: 39 U/L (ref 14–54)
AST: 28 U/L (ref 15–41)
Albumin: 4 g/dL (ref 3.5–5.0)
Alkaline Phosphatase: 56 U/L (ref 38–126)
Anion gap: 6 (ref 5–15)
BUN: 7 mg/dL (ref 6–20)
CO2: 27 mmol/L (ref 22–32)
Calcium: 9.4 mg/dL (ref 8.9–10.3)
Chloride: 102 mmol/L (ref 101–111)
Creatinine, Ser: 0.8 mg/dL (ref 0.44–1.00)
GFR calc Af Amer: >60 mL/min (ref >60)
GFR calc non Af Amer: >60 mL/min (ref >60)
Glucose, Bld: 106 mg/dL — ABNORMAL HIGH (ref 65–99)
Potassium: 3.7 mmol/L (ref 3.5–5.1)
Sodium: 135 mmol/L (ref 135–145)
Total Bilirubin: 0.9 mg/dL (ref 0.3–1.2)
Total Protein: 7.1 g/dL (ref 6.5–8.1)

## 2015-11-02 LAB — CBC WITH DIFFERENTIAL/PLATELET
Basophils Absolute: 0 10*3/uL (ref 0.0–0.1)
Basophils Relative: 0 %
Eosinophils Absolute: 0.2 10*3/uL (ref 0.0–0.7)
Eosinophils Relative: 4 %
HCT: 39.4 % (ref 36.0–46.0)
Hemoglobin: 12.4 g/dL (ref 12.0–15.0)
Lymphocytes Relative: 34 %
Lymphs Abs: 1.9 10*3/uL (ref 0.7–4.0)
MCH: 29.7 pg (ref 26.0–34.0)
MCHC: 31.5 g/dL (ref 30.0–36.0)
MCV: 94.5 fL (ref 78.0–100.0)
Monocytes Absolute: 0.2 10*3/uL (ref 0.1–1.0)
Monocytes Relative: 4 %
Neutro Abs: 3.3 10*3/uL (ref 1.7–7.7)
Neutrophils Relative %: 58 %
Platelets: 290 10*3/uL (ref 150–400)
RBC: 4.17 MIL/uL (ref 3.87–5.11)
RDW: 13.7 % (ref 11.5–15.5)
WBC: 5.6 10*3/uL (ref 4.0–10.5)

## 2015-11-02 MED ORDER — DICLOFENAC POTASSIUM 50 MG PO TABS
50.0000 mg | ORAL_TABLET | Freq: Three times a day (TID) | ORAL | Status: DC
Start: 2015-11-02 — End: 2017-09-08

## 2015-11-02 MED ORDER — PREDNISONE 20 MG PO TABS: ORAL_TABLET | ORAL | Status: DC

## 2015-11-02 MED ORDER — AMOXICILLIN-POT CLAVULANATE 875-125 MG PO TABS: 1.0000 | ORAL_TABLET | Freq: Two times a day (BID) | ORAL | Status: DC

## 2015-11-02 MED ORDER — TRAMADOL HCL 50 MG PO TABS: 50.0000 mg | ORAL_TABLET | Freq: Four times a day (QID) | ORAL | Status: DC | PRN

## 2015-11-02 NOTE — ED Notes (Signed)
The patient presented to the [facility] with a complaint of pain in her right eye and ear as well as a pain in her right buttock that extends to her knee. The patient stated that the pain has been ongoing for 2 weeks.

## 2015-11-02 NOTE — Discharge Instructions (Signed)

## 2015-11-02 NOTE — ED Provider Notes (Signed)
CSN: [id number]     Arrival date & time 11/02/15  1640 History   First MD Initiated Contact with Patient 11/02/15 1732     Chief Complaint  Patient presents with  . Otalgia  . Extremity Pain   (Consider location/radiation/quality/duration/timing/severity/associated sxs/prior Treatment) HPI  Lisa Woodward is a 45 y.o. female presenting to UC with c/o Right ear and eye pain, Right arm pain, and Right lower back pain that goes into her Right thigh.  Symptoms have been ongoing for about 2 weeks. Ibuprofen does help. Reports mild nasal congestion and cough for about 2 weeks as well. Pain is aching and sore. Denies fever, chills, n/v/d.  Denies falls or known injuries.  Pt also notes toward end of exam she gets "night sweats" when lying on her abdomen. Hx of abdominal hernia repair a few years ago. Denies abdominal pain at this time but concerned for discomfort when lying on abdomen.  She does not have a PCP.   History reviewed. No pertinent past medical history. Past Surgical History  Procedure Laterality Date  . Cholecystectomy     History reviewed. No pertinent family history. Social History  Substance Use Topics  . Smoking status: Never Smoker   . Smokeless tobacco: None  . Alcohol Use: No   OB History    No data available     Review of Systems  Constitutional: Negative for fever and chills.  HENT: Positive for congestion, ear pain (Right) and rhinorrhea. Negative for sinus pressure, sore throat, trouble swallowing and voice change.   Respiratory: Positive for cough. Negative for shortness of breath.   Cardiovascular: Negative for chest pain and palpitations.  Gastrointestinal: Positive for abdominal pain ( "discomfort"). Negative for nausea, vomiting and diarrhea.  Genitourinary: Negative for dysuria, frequency and flank pain.  Musculoskeletal: Positive for myalgias and back pain. Negative for arthralgias, neck pain and neck stiffness.  Skin: Negative for rash.   Neurological: Positive for headaches. Negative for dizziness and light-headedness.    Allergies  Review of patient's allergies indicates no known allergies.  Home Medications   Prior to Admission medications   Medication Sig Start Date End Date Taking? Authorizing Provider  ibuprofen (ADVIL,MOTRIN) 800 MG tablet Take 1 tablet (800 mg total) by mouth 3 (three) times daily. 07/08/13  Yes Garlon HatchetLisa M Sanders, PA-C  albuterol (PROVENTIL HFA;VENTOLIN HFA) 108 (90 BASE) MCG/ACT inhaler Inhale 2 puffs into the lungs every 4 (four) hours as needed for wheezing or shortness of breath. 07/04/14   Hayden Rasmussenavid Mabe, NP  amoxicillin-clavulanate (AUGMENTIN) 875-125 MG tablet Take 1 tablet by mouth 2 (two) times daily. One po bid x 7 days 11/02/15   Junius FinnerErin O'Malley, PA-C  Aspirin-Salicylamide-Caffeine [facility](BC HEADACHE POWDER PO) Take 1 packet by mouth daily as needed (pain).     Historical Provider, MD  bismuth subsalicylate (PEPTO BISMOL) 262 MG/15ML suspension Take 30 mLs by mouth every 6 (six) hours as needed for indigestion.    Historical Provider, MD  diclofenac (CATAFLAM) 50 MG tablet Take 1 tablet (50 mg total) by mouth 3 (three) times daily. One tablet TID with food prn pain. 11/02/15   Junius FinnerErin O'Malley, PA-C  Omega-3 Fatty Acids (FISH OIL PO) Take 1 capsule by mouth daily.    Historical Provider, MD  predniSONE (DELTASONE) 20 MG tablet 3 tabs po day one, then 2 po daily x 4 days 11/02/15   Junius FinnerErin O'Malley, PA-C  traMADol (ULTRAM) 50 MG tablet Take 1 tablet (50 mg total) by mouth every 6 (six) hours as  needed. 11/02/15   Junius Finner, PA-C   Meds Ordered and Administered this Visit  Medications - No data to display  BP 138/89 mmHg  Pulse 69  Temp(Src) 98.3 F (36.8 C) (Oral)  Resp 18  SpO2 100%  LMP 01/01/2012 No data found.   Physical Exam  Constitutional: She appears well-developed and well-nourished. No distress.  HENT:  Head: Normocephalic and atraumatic.  Right Ear: Tympanic membrane is erythematous and  bulging.  Left Ear: Tympanic membrane normal.  Nose: Nose normal.  Mouth/Throat: Uvula is midline, oropharynx is clear and moist and mucous membranes are normal.  Eyes: Conjunctivae are normal. No scleral icterus.  Neck: Normal range of motion. Neck supple.  No midline spinal tenderness. Mild tenderness to to Right side cervical muscles.   Cardiovascular: Normal rate, regular rhythm and normal heart sounds.   Pulmonary/Chest: Effort normal and breath sounds normal. No stridor. No respiratory distress. She has no wheezes. She has no rales.  Abdominal: Soft. Bowel sounds are normal. She exhibits no distension and no mass. There is no tenderness. There is no rebound and no guarding.  Soft, non-distended, non-tender.  Musculoskeletal: Normal range of motion. She exhibits tenderness. She exhibits no edema.  No midline spinal tenderness. Tenderness to Right lower lumbar muscles and Right lateral thigh muscles. Negative straight leg raise.  Lymphadenopathy:    She has no cervical adenopathy.  Neurological: She is alert.  Skin: Skin is warm and dry. No rash noted. She is not diaphoretic. No erythema.  Nursing note and vitals reviewed.   ED Course  Procedures (including critical care time)  Labs Review Labs Reviewed  CBC WITH DIFFERENTIAL/PLATELET  COMPREHENSIVE METABOLIC PANEL    Imaging Review No results found.    MDM   1. Right acute serous otitis media, recurrence not specified   2. Right arm pain   3. Right-sided low back pain with right-sided sciatica   4. Abdominal discomfort    Exam c/w Right AOM and Right sided sciatica. Pt also c/o abdominal discomfort, only when lying on abdomen at night. Pt does not have PCP. Will check abdominal labs, however, low concern for surgical abdomen at this time.  Rx: Augmentin, prednisone, tramadol, and diclofenac.  Encouraged f/u with PCP in 1 week if not improving, sooner if worsening. Patient verbalized understanding and agreement with  treatment plan.     Junius Finner, PA-C 11/02/15 1840

## 2015-11-10 NOTE — ED Notes (Signed)
Spoke  With patient  About    Pharmacy  About rx   Pharmacist  Called  Pt  And  Fixed  The   Situation

## 2017-03-08 ENCOUNTER — Other Ambulatory Visit: Payer: Self-pay

## 2017-03-08 ENCOUNTER — Encounter (HOSPITAL_COMMUNITY): Payer: Self-pay | Admitting: Emergency Medicine

## 2017-03-08 ENCOUNTER — Emergency Department (HOSPITAL_COMMUNITY)
Admission: EM | Admit: 2017-03-08 | Discharge: 2017-03-08 | Disposition: A | Discharge status: Home / Self Care | Payer: Self-pay | Attending: Emergency Medicine | Admitting: Emergency Medicine

## 2017-03-08 DIAGNOSIS — Z79899 Other long term (current) drug therapy: Secondary | ICD-10-CM | POA: Insufficient documentation

## 2017-03-08 DIAGNOSIS — J069 Acute upper respiratory infection, unspecified: Secondary | ICD-10-CM | POA: Insufficient documentation

## 2017-03-08 DIAGNOSIS — R519 Headache, unspecified: Secondary | ICD-10-CM

## 2017-03-08 DIAGNOSIS — R51 Headache: Secondary | ICD-10-CM

## 2017-03-08 DIAGNOSIS — R0981 Nasal congestion: Secondary | ICD-10-CM

## 2017-03-08 LAB — URINALYSIS, ROUTINE W REFLEX MICROSCOPIC
Bilirubin Urine: NEGATIVE
Glucose, UA: NEGATIVE mg/dL
HGB URINE DIPSTICK: NEGATIVE
Ketones, ur: NEGATIVE mg/dL
Leukocytes, UA: NEGATIVE
Nitrite: NEGATIVE
PH: 6 (ref 5.0–8.0)
Protein, ur: NEGATIVE mg/dL
Specific Gravity, Urine: 1.016 (ref 1.005–1.030)

## 2017-03-08 LAB — COMPREHENSIVE METABOLIC PANEL
ALT: 34 U/L (ref 14–54)
AST: 25 U/L (ref 15–41)
Albumin: 3.9 g/dL (ref 3.5–5.0)
Alkaline Phosphatase: 62 U/L (ref 38–126)
Anion gap: 9 (ref 5–15)
BUN: 9 mg/dL (ref 6–20)
CHLORIDE: 104 mmol/L (ref 101–111)
CO2: 25 mmol/L (ref 22–32)
Calcium: 9.4 mg/dL (ref 8.9–10.3)
Creatinine, Ser: 0.88 mg/dL (ref 0.44–1.00)
GFR calc Af Amer: >60 mL/min (ref >60)
GFR calc non Af Amer: >60 mL/min (ref >60)
GLUCOSE: 170 mg/dL — AB (ref 65–99)
Potassium: 3.8 mmol/L (ref 3.5–5.1)
Sodium: 138 mmol/L (ref 135–145)
Total Bilirubin: 0.9 mg/dL (ref 0.3–1.2)
Total Protein: 7.2 g/dL (ref 6.5–8.1)

## 2017-03-08 LAB — LIPASE, BLOOD: LIPASE: 21 U/L (ref 11–51)

## 2017-03-08 LAB — CBC
HCT: 40.7 % (ref 36.0–46.0)
Hemoglobin: 13.1 g/dL (ref 12.0–15.0)
MCH: 30.7 pg (ref 26.0–34.0)
MCHC: 32.2 g/dL (ref 30.0–36.0)
MCV: 95.3 fL (ref 78.0–100.0)
Platelets: 244 10*3/uL (ref 150–400)
RBC: 4.27 MIL/uL (ref 3.87–5.11)
RDW: 13.5 % (ref 11.5–15.5)
WBC: 6.2 10*3/uL (ref 4.0–10.5)

## 2017-03-08 NOTE — ED Notes (Signed)
Mercedes PA at bedsdie

## 2017-03-08 NOTE — ED Triage Notes (Signed)
Pt. Stated, I started yesterday having stomach ache and back pain.  Denies any other symptoms

## 2017-03-08 NOTE — Discharge Instructions (Signed)
Continue to stay well-hydrated. Gargle warm salt water and spit it out and Use chloraseptic spray as needed for sore throat. Continue to alternate between Tylenol and Ibuprofen for pain or fever. Use Mucinex for cough suppression/expectoration of mucus. Use netipot and flonase to help with nasal congestion. May consider over-the-counter Benadryl or other antihistamine to decrease secretions and for help with your symptoms. Follow up with your primary care doctor in 5-7 days for recheck of ongoing symptoms; if you don't have a regular doctor then [facility] for recheck of symptoms and to establish medical care. Return to emergency department for emergent changing or worsening of symptoms.

## 2017-03-08 NOTE — ED Provider Notes (Signed)
[facility] Provider Note   CSN: [id number] Arrival date & time: 03/08/17  [id number]     History   Chief Complaint Chief Complaint  Patient presents with  . Abdominal Pain  . Back Pain    HPI Lisa Woodward is a 46 y.o. female with a PSHx of cholecystectomy, who presents to the ED with complaints of sinus congestion with clear drainage, frontal headache, nausea, chills, clogged sensation in her right ear, and dry cough times 2 days.  She describes the headache as 8/10 intermittent throbbing nonradiating frontal headache with no known aggravating or alleviating factors, no treatments tried prior to arrival.  She denies any fevers, ear drainage, vision changes, lightheadedness, sore throat, CV, S OB, abdominal pain, vomiting, diarrhea, constipation, dysuria, hematuria, numbness, tingling, focal weakness, or any other complaints at this time.  She denies any recent travel or sick contacts.  She is a non-smoker.   The history is provided by the patient and medical records. No language interpreter was used.  Headache   This is a new problem. The current episode started 2 days ago. Episode frequency: intermittent. The problem has not changed since onset.Associated with: sinus congestion. The pain is located in the frontal region. The quality of the pain is described as throbbing. The pain is at a severity of 8/10. The pain is moderate. The pain does not radiate. Associated symptoms include nausea. Pertinent negatives include no fever, no shortness of breath and no vomiting. She has tried nothing for the symptoms. The treatment provided no relief.    History reviewed. No pertinent past medical history.  There are no active problems to display for this patient.   Past Surgical History:  Procedure Laterality Date  . CHOLECYSTECTOMY      OB History    No data available       Home Medications    Prior to Admission medications   Medication Sig  Start Date End Date Taking? Authorizing Provider  albuterol (PROVENTIL HFA;VENTOLIN HFA) 108 (90 BASE) MCG/ACT inhaler Inhale 2 puffs into the lungs every 4 (four) hours as needed for wheezing or shortness of breath. 07/04/14   Hayden RasmussenMabe, David, NP  amoxicillin-clavulanate (AUGMENTIN) 875-125 MG tablet Take 1 tablet by mouth 2 (two) times daily. One po bid x 7 days 11/02/15   Lurene ShadowPhelps, Erin O, PA-C  Aspirin-Salicylamide-Caffeine [facility](BC HEADACHE POWDER PO) Take 1 packet by mouth daily as needed (pain).     [provider]  bismuth subsalicylate (PEPTO BISMOL) 262 MG/15ML suspension Take 30 mLs by mouth every 6 (six) hours as needed for indigestion.    [provider]  diclofenac (CATAFLAM) 50 MG tablet Take 1 tablet (50 mg total) by mouth 3 (three) times daily. One tablet TID with food prn pain. 11/02/15   Lurene ShadowPhelps, Erin O, PA-C  ibuprofen (ADVIL,MOTRIN) 800 MG tablet Take 1 tablet (800 mg total) by mouth 3 (three) times daily. 07/08/13   Garlon HatchetSanders, Lisa M, PA-C  Omega-3 Fatty Acids (FISH OIL PO) Take 1 capsule by mouth daily.    [provider]  predniSONE (DELTASONE) 20 MG tablet 3 tabs po day one, then 2 po daily x 4 days 11/02/15   Lurene ShadowPhelps, Erin O, PA-C  traMADol (ULTRAM) 50 MG tablet Take 1 tablet (50 mg total) by mouth every 6 (six) hours as needed. 11/02/15   Lurene ShadowPhelps, Erin O, PA-C    Family History No family history on file.  Social History Social History   Tobacco Use  .  Smoking status: Never Smoker  . Smokeless tobacco: Never Used  Substance Use Topics  . Alcohol use: No  . Drug use: No     Allergies   Patient has no known allergies.   Review of Systems Review of Systems  Constitutional: Positive for chills. Negative for fever.  HENT: Positive for congestion, ear pain (clogged feeling) and rhinorrhea. Negative for ear discharge and sore throat.   Eyes: Negative for visual disturbance.  Respiratory: Positive for cough. Negative for shortness of breath.     Cardiovascular: Negative for chest pain.  Gastrointestinal: Positive for nausea. Negative for abdominal pain, constipation, diarrhea and vomiting.  Genitourinary: Negative for dysuria and hematuria.  Musculoskeletal: Negative for arthralgias and myalgias.  Skin: Negative for color change.  Allergic/Immunologic: Negative for immunocompromised state.  Neurological: Positive for headaches. Negative for weakness, light-headedness and numbness.  Psychiatric/Behavioral: Negative for confusion.   All other systems reviewed and are negative for acute change except as noted in the HPI.    Physical Exam Updated Vital Signs BP (!) 156/93 (BP Location: Right Arm)   Pulse 61   Temp 97.8 F (36.6 C) (Oral)   Resp 16   Ht 6\' 1"  (1.854 m)   Wt 77.1 kg (170 lb)   LMP 01/01/2012   SpO2 100%   BMI 22.43 kg/m   Physical Exam  Constitutional: She is oriented to person, place, and time. Vital signs are normal. She appears well-developed and well-nourished.  Non-toxic appearance. No distress.  Afebrile, nontoxic, NAD  HENT:  Head: Normocephalic and atraumatic.  Right Ear: Hearing, tympanic membrane, external ear and ear canal normal.  Left Ear: Hearing, tympanic membrane, external ear and ear canal normal.  Nose: Mucosal edema and rhinorrhea present. Right sinus exhibits maxillary sinus tenderness and frontal sinus tenderness. Left sinus exhibits no maxillary sinus tenderness and no frontal sinus tenderness.  Mouth/Throat: Uvula is midline, oropharynx is clear and moist and mucous membranes are normal. No trismus in the jaw. No uvula swelling. Tonsils are 0 on the right. Tonsils are 0 on the left. No tonsillar exudate.  Ears are clear bilaterally. Nose congested with scant rhinorrhea, mild sinus TTP on right frontal and maxillary sinuses. Oropharynx clear and moist, without uvular swelling or deviation, no trismus or drooling, no tonsillar swelling or erythema, no exudates.    Eyes: Conjunctivae and  EOM are normal. Pupils are equal, round, and reactive to light. Right eye exhibits no discharge. Left eye exhibits no discharge.  PERRL, EOMI, no nystagmus, no visual field deficits   Neck: Normal range of motion. Neck supple. No spinous process tenderness and no muscular tenderness present. No neck rigidity. Normal range of motion present.  FROM intact without spinous process TTP, no bony stepoffs or deformities, no paraspinous muscle TTP or muscle spasms. No rigidity or meningeal signs. No bruising or swelling.   Cardiovascular: Normal rate, regular rhythm, normal heart sounds and intact distal pulses. Exam reveals no gallop and no friction rub.  No murmur heard. Pulmonary/Chest: Effort normal and breath sounds normal. No respiratory distress. She has no decreased breath sounds. She has no wheezes. She has no rhonchi. She has no rales.  Abdominal: Soft. Normal appearance and bowel sounds are normal. She exhibits no distension. There is no tenderness. There is no rigidity, no rebound, no guarding, no CVA tenderness, no tenderness at McBurney's point and negative Murphy's sign.  Musculoskeletal: Normal range of motion.  MAE x4 Strength and sensation grossly intact in all extremities Distal pulses intact Gait  steady  Neurological: She is alert and oriented to person, place, and time. She has normal strength. No cranial nerve deficit or sensory deficit. Coordination and gait normal. GCS eye subscore is 4. GCS verbal subscore is 5. GCS motor subscore is 6.  CN 2-12 grossly intact A&O x4 GCS 15 Sensation and strength intact Gait nonataxic including with tandem walking Coordination with finger-to-nose WNL Neg pronator drift   Skin: Skin is warm, dry and intact. No rash noted.  Psychiatric: She has a normal mood and affect.  Nursing note and vitals reviewed.    ED Treatments / Results  Labs (all labs ordered are listed, but only abnormal results are displayed) Labs Reviewed  COMPREHENSIVE  METABOLIC PANEL - Abnormal; Notable for the following components:      Result Value   Glucose, Bld 170 (*)    All other components within normal limits  LIPASE, BLOOD  CBC  URINALYSIS, ROUTINE W REFLEX MICROSCOPIC    EKG  EKG Interpretation None       Radiology No results found.  Procedures Procedures (including critical care time)  Medications Ordered in ED Medications - No data to display   Initial Impression / Assessment and Plan / ED Course  I have reviewed the triage vital signs and the nursing notes.  Pertinent labs & imaging results that were available during my care of the patient were reviewed by me and considered in my medical decision making (see chart for details).     46 y.o. female here with sinus headache/congestion and dry cough, nausea, chills. Pt is afebrile with a clear lung exam. Mild sinus congestion and sinus TTP, no abdominal tenderness, no focal neuro deficits. Likely viral URI. Labs done in triage are fairly unremarkable aside from glucose 170 but this was nonfasting. Pt is agreeable to symptomatic treatment with close follow up with PCP/[facility] to recheck on symptoms (and establish care if she has no PCP) but spoke at length about emergent changing or worsening of symptoms that should prompt return to ER. Pt voices understanding and is agreeable to plan. Stable at time of discharge.    Final Clinical Impressions(s) / ED Diagnoses   Final diagnoses:  Viral upper respiratory tract infection  Sinus congestion  Sinus headache    ED Discharge Orders    [address]       Adison Jerger, [place], [place]PA-C 03/08/17 1057    Cathren LaineSteinl, Kevin, MD 03/08/17 [phone]

## 2017-09-08 ENCOUNTER — Ambulatory Visit (HOSPITAL_COMMUNITY)
Admission: EM | Admit: 2017-09-08 | Discharge: 2017-09-08 | Disposition: A | Discharge status: Home / Self Care | Payer: Commercial Managed Care - PPO | Attending: Internal Medicine | Admitting: Internal Medicine

## 2017-09-08 ENCOUNTER — Encounter (HOSPITAL_COMMUNITY): Payer: Self-pay | Admitting: Emergency Medicine

## 2017-09-08 DIAGNOSIS — M5432 Sciatica, left side: Secondary | ICD-10-CM

## 2017-09-08 DIAGNOSIS — G8929 Other chronic pain: Secondary | ICD-10-CM

## 2017-09-08 DIAGNOSIS — R0981 Nasal congestion: Secondary | ICD-10-CM

## 2017-09-08 DIAGNOSIS — M25562 Pain in left knee: Secondary | ICD-10-CM

## 2017-09-08 MED ORDER — TRIAMCINOLONE ACETONIDE 55 MCG/ACT NA AERO: 2.0000 | INHALATION_SPRAY | Freq: Every day | NASAL | 0 refills | Status: DC

## 2017-09-08 MED ORDER — NAPROXEN 500 MG PO TABS: 500.0000 mg | ORAL_TABLET | Freq: Two times a day (BID) | ORAL | 0 refills | Status: DC

## 2017-09-08 NOTE — ED Triage Notes (Addendum)
Pt sts right knee pain; right ear and eye pain with some blurred vision

## 2017-09-08 NOTE — ED Provider Notes (Signed)
[facility]    CSN: [id number] Arrival date & time: 09/08/17  1636     History   Chief Complaint Chief Complaint  Patient presents with  . Knee Pain  . Eye Pain    HPI Lisa Woodward is a 47 y.o. female. she presents today with persistent pain in the left knee, radiation along lateral thigh with sharp pain also in left buttock.  Has been going on for a year or more.  No weakness/clumsiness of legs, no change in bowel/bladder function. Also for about the last month, has had right ear pressure/discomfort and mucusy discharge right eye.  No fever, not coughing.  No malaise.    HPI  History reviewed. No pertinent past medical history.   Past Surgical History:  Procedure Laterality Date  . CHOLECYSTECTOMY       Home Medications    Prior to Admission medications   Medication Sig Start Date End Date Taking? Authorizing Provider  albuterol (PROVENTIL HFA;VENTOLIN HFA) 108 (90 BASE) MCG/ACT inhaler Inhale 2 puffs into the lungs every 4 (four) hours as needed for wheezing or shortness of breath. 07/04/14   Hayden Rasmussen, NP  Aspirin-Salicylamide-Caffeine (BC HEADACHE POWDER PO) Take 1 packet by mouth daily as needed (pain).     [provider]  bismuth subsalicylate (PEPTO BISMOL) 262 MG/15ML suspension Take 30 mLs by mouth every 6 (six) hours as needed for indigestion.    [provider]  ibuprofen (ADVIL,MOTRIN) 800 MG tablet Take 1 tablet (800 mg total) by mouth 3 (three) times daily. 07/08/13   Garlon Hatchet, PA-C  Omega-3 Fatty Acids (FISH OIL PO) Take 1 capsule by mouth daily.    [provider]    Family History History reviewed. No pertinent family history.  Social History Social History   Tobacco Use  . Smoking status: Never Smoker  . Smokeless tobacco: Never Used  Substance Use Topics  . Alcohol use: No  . Drug use: No     Allergies   Patient has no known allergies.   Review of Systems Review of Systems  All  other systems reviewed and are negative.    Physical Exam Triage Vital Signs ED Triage Vitals [09/08/17 1818]  Enc Vitals Group     BP (!) 150/48     Pulse Rate 73     Resp 18     Temp 98.3 F (36.8 C)     Temp Source Oral     SpO2 98 %     Weight      Height      Head Circumference      Peak Flow      Pain Score      Pain Loc      Pain Edu?      Excl. in GC?    No data found.  Updated Vital Signs BP (!) 150/48 (BP Location: Left Arm)   Pulse 73   Temp 98.3 F (36.8 C) (Oral)   Resp 18   LMP 01/01/2012   SpO2 98%   Visual Acuity Right Eye Distance: 20/25 Left Eye Distance: 20/20 Bilateral Distance: 20/20  Right Eye Near:   Left Eye Near:    Bilateral Near:     Physical Exam  Constitutional: She is oriented to person, place, and time. No distress.  Sitting up on exam table  HENT:  Head: Atraumatic.  R TM is dull, no erythema; L TM is mildly dull, no erythema.  Moderate nasal congestion right nare.  Throat is slightly injected.   Eyes:  Conjugate gaze observed, no eye redness/discharge appreciated at time of exam.  Neck: Neck supple.  Cardiovascular: Normal rate and regular rhythm.  Pulmonary/Chest: No respiratory distress. She has no wheezes. She has no rales.  Lungs clear, symmetric breath sounds   Abdominal: She exhibits no distension.  Musculoskeletal: Normal range of motion.  Full and symmetric ROM at B knees including full flexion.  No effusion/warmth/erythema.  Skin intact.  No bruising.  Neurological: She is alert and oriented to person, place, and time.  Able to walk into the urgent care independently, climb on/off exam table.  Gait steady, no limping, no foot drop.  Skin: Skin is warm and dry.  Nursing note and vitals reviewed.    UC Treatments / Results  Labs (all labs ordered are listed, but only abnormal results are displayed) Labs Reviewed - No data to display  EKG None  Radiology No results found.  Procedures Procedures  (including critical care time) None today  Medications Ordered in UC Medications - No data to display  Final Clinical Impressions(s) / UC Diagnoses   Final diagnoses:  Sinus congestion  Chronic sciatica, left  Chronic pain of left knee     Discharge Instructions     Left knee pain may be coming from knee or from low back.  Prescription for naproxen (anti inflammatory/pain reliever) to try instead of advil was sent to the pharmacy.  It is also fine to just keep taking advil 3 tablets twice daily for pain.  It may be useful to visit a spine specialist, sports medicine care provider, or orthopedist to give other options for managing the knee/back pain.  Some office numbers are in this handout.   Right ear discomfort/eye discharge seem most likely related to irritation or allergy from pollens.  Prescription for triamcinolone (nasacort nasal steroid spray) was sent to the pharmacy also.  Recheck for new fever >100.5, increasing phlegm production/nasal discharge, or if not starting to improve in a few days.       ED Prescriptions    Medication Sig Dispense Auth. Provider   naproxen (NAPROSYN) 500 MG tablet Take 1 tablet (500 mg total) by mouth 2 (two) times daily. 30 tablet Isa Rankin, MD   triamcinolone (NASACORT) 55 MCG/ACT AERO nasal inhaler Place 2 sprays into the nose daily. 1 Inhaler Isa Rankin, MD        Isa Rankin, MD 09/09/17 2114

## 2017-09-08 NOTE — Discharge Instructions (Signed)
Left knee pain may be coming from knee or from low back.  Prescription for naproxen (anti inflammatory/pain reliever) to try instead of advil was sent to the pharmacy.  It is also fine to just keep taking advil 3 tablets twice daily for pain.  It may be useful to visit a spine specialist, sports medicine care provider, or orthopedist to give other options for managing the knee/back pain.  Some office numbers are in this handout.   Right ear discomfort/eye discharge seem most likely related to irritation or allergy from pollens.  Prescription for triamcinolone (nasacort nasal steroid spray) was sent to the pharmacy also.  Recheck for new fever >100.5, increasing phlegm production/nasal discharge, or if not starting to improve in a few days.

## 2018-03-25 ENCOUNTER — Encounter (HOSPITAL_COMMUNITY): Payer: Self-pay | Admitting: Emergency Medicine

## 2018-03-25 ENCOUNTER — Emergency Department (HOSPITAL_COMMUNITY)
Admission: EM | Admit: 2018-03-25 | Discharge: 2018-03-25 | Disposition: A | Discharge status: Home / Self Care | Payer: Commercial Managed Care - PPO | Attending: Emergency Medicine | Admitting: Emergency Medicine

## 2018-03-25 DIAGNOSIS — Z79899 Other long term (current) drug therapy: Secondary | ICD-10-CM | POA: Diagnosis not present

## 2018-03-25 DIAGNOSIS — R1013 Epigastric pain: Secondary | ICD-10-CM | POA: Diagnosis present

## 2018-03-25 LAB — URINALYSIS, ROUTINE W REFLEX MICROSCOPIC
Bilirubin Urine: NEGATIVE
Glucose, UA: NEGATIVE mg/dL
HGB URINE DIPSTICK: NEGATIVE
Ketones, ur: NEGATIVE mg/dL
Leukocytes, UA: NEGATIVE
Nitrite: NEGATIVE
Protein, ur: NEGATIVE mg/dL
SPECIFIC GRAVITY, URINE: 1.012 (ref 1.005–1.030)
pH: 6 (ref 5.0–8.0)

## 2018-03-25 LAB — CBC
HCT: 40.7 % (ref 36.0–46.0)
HEMOGLOBIN: 12.8 g/dL (ref 12.0–15.0)
MCH: 30.5 pg (ref 26.0–34.0)
MCHC: 31.4 g/dL (ref 30.0–36.0)
MCV: 96.9 fL (ref 80.0–100.0)
Platelets: 258 10*3/uL (ref 150–400)
RBC: 4.2 MIL/uL (ref 3.87–5.11)
RDW: 12.9 % (ref 11.5–15.5)
WBC: 5.9 10*3/uL (ref 4.0–10.5)
nRBC: 0 % (ref 0.0–0.2)

## 2018-03-25 LAB — COMPREHENSIVE METABOLIC PANEL
ALBUMIN: 4 g/dL (ref 3.5–5.0)
ALT: 32 U/L (ref 0–44)
ANION GAP: 10 (ref 5–15)
AST: 22 U/L (ref 15–41)
Alkaline Phosphatase: 57 U/L (ref 38–126)
BUN: 9 mg/dL (ref 6–20)
CO2: 23 mmol/L (ref 22–32)
Calcium: 9.5 mg/dL (ref 8.9–10.3)
Chloride: 105 mmol/L (ref 98–111)
Creatinine, Ser: 0.79 mg/dL (ref 0.44–1.00)
GFR calc Af Amer: >60 mL/min (ref >60)
GFR calc non Af Amer: >60 mL/min (ref >60)
GLUCOSE: 124 mg/dL — AB (ref 70–99)
Potassium: 3.8 mmol/L (ref 3.5–5.1)
Sodium: 138 mmol/L (ref 135–145)
Total Bilirubin: 1.1 mg/dL (ref 0.3–1.2)
Total Protein: 7.4 g/dL (ref 6.5–8.1)

## 2018-03-25 LAB — I-STAT BETA HCG BLOOD, ED (MC, WL, AP ONLY): I-stat hCG, quantitative: <5 m[IU]/mL (ref <5)

## 2018-03-25 LAB — LIPASE, BLOOD: LIPASE: 26 U/L (ref 11–51)

## 2018-03-25 MED ORDER — ONDANSETRON HCL 4 MG/2ML IJ SOLN
4.0000 mg | Freq: Once | INTRAMUSCULAR | Status: AC
  Administered 2018-03-25: 4 mg via INTRAVENOUS
  Filled 2018-03-25: qty 2

## 2018-03-25 MED ORDER — DIAZEPAM 5 MG PO TABS
5.0000 mg | ORAL_TABLET | Freq: Once | ORAL | Status: AC
  Administered 2018-03-25: 5 mg via ORAL
  Filled 2018-03-25: qty 1

## 2018-03-25 MED ORDER — ALUM & MAG HYDROXIDE-SIMETH 200-200-20 MG/5ML PO SUSP
30.0000 mL | Freq: Once | ORAL | Status: AC
  Administered 2018-03-25: 30 mL via ORAL
  Filled 2018-03-25: qty 30

## 2018-03-25 MED ORDER — OMEPRAZOLE 20 MG PO CPDR: 20.0000 mg | DELAYED_RELEASE_CAPSULE | Freq: Every day | ORAL | 0 refills | Status: DC

## 2018-03-25 MED ORDER — FAMOTIDINE IN NACL 20-0.9 MG/50ML-% IV SOLN
20.0000 mg | Freq: Once | INTRAVENOUS | Status: AC
  Administered 2018-03-25: 20 mg via INTRAVENOUS
  Filled 2018-03-25: qty 50

## 2018-03-25 NOTE — Discharge Instructions (Signed)
You were seen today for multiple symptoms including abdominal pain.  Your work-up was reassuring.  Your pain may be related to some gastritis which could be related to alcohol use.  You should decrease alcohol use.  Take omeprazole daily as needed.  Follow-up with gastroenterology.

## 2018-03-25 NOTE — ED Notes (Signed)
Patient verbalizes understanding of medications and discharge instructions. No further questions at this time. VSS and patient ambulatory at discharge.   

## 2018-03-25 NOTE — ED Provider Notes (Signed)
[facility] Note   CSN: [id number] Arrival date & time: 03/25/18  0009     History   Chief Complaint Chief Complaint  Patient presents with  . Abdominal Pain  . Nausea    HPI Lisa Woodward is a 47 y.o. female.  HPI  This is a 47 year old female who presents with abdominal pain and nausea.  Patient also reports right sided neck pain and headache.  Patient reports onset of symptoms within the last 1 to 2 days.  She reports epigastric and left upper quadrant dull pain that is nonradiating.  Currently she rates her pain at 5 out of 10.  She reports nausea without vomiting.  Denies diarrhea or constipation.  Pain does not seem to be worsened with eating.  She does drink daily and prefers beer.  Additionally she reports right-sided neck and shoulder pain with associated headache.  Pain is worse with range of motion of the neck.  Denies any injury or heavy lifting.  She has not taken anything for her symptoms.  Denies any weakness, numbness, tingling, strokelike symptoms.  History reviewed. No pertinent past medical history.  There are no active problems to display for this patient.   Past Surgical History:  Procedure Laterality Date  . CHOLECYSTECTOMY       OB History   None      Home Medications    Prior to Admission medications   Medication Sig Start Date End Date Taking? Authorizing Provider  albuterol (PROVENTIL HFA;VENTOLIN HFA) 108 (90 BASE) MCG/ACT inhaler Inhale 2 puffs into the lungs every 4 (four) hours as needed for wheezing or shortness of breath. 07/04/14   Hayden RasmussenMabe, David, NP  Aspirin-Salicylamide-Caffeine (BC HEADACHE POWDER PO) Take 1 packet by mouth daily as needed (pain).     [provider]  bismuth subsalicylate (PEPTO BISMOL) 262 MG/15ML suspension Take 30 mLs by mouth every 6 (six) hours as needed for indigestion.    [provider]  ibuprofen (ADVIL,MOTRIN) 800 MG tablet Take 1 tablet  (800 mg total) by mouth 3 (three) times daily. 07/08/13   Garlon HatchetSanders, Lisa M, PA-C  naproxen (NAPROSYN) 500 MG tablet Take 1 tablet (500 mg total) by mouth 2 (two) times daily. 09/08/17   Isa RankinMurray, Laura Wilson, MD  Omega-3 Fatty Acids (FISH OIL PO) Take 1 capsule by mouth daily.    [provider]  omeprazole (PRILOSEC) 20 MG capsule Take 1 capsule (20 mg total) by mouth daily. 03/25/18   Horton, Mayer Maskerourtney F, MD  triamcinolone (NASACORT) 55 MCG/ACT AERO nasal inhaler Place 2 sprays into the nose daily. 09/08/17   Isa RankinMurray, Laura Wilson, MD    Family History No family history on file.  Social History Social History   Tobacco Use  . Smoking status: Never Smoker  . Smokeless tobacco: Never Used  Substance Use Topics  . Alcohol use: No  . Drug use: No     Allergies   Patient has no known allergies.   Review of Systems Review of Systems  Constitutional: Negative for fever.  Respiratory: Negative for shortness of breath.   Cardiovascular: Negative for chest pain.  Gastrointestinal: Positive for abdominal pain and nausea. Negative for constipation, diarrhea and vomiting.  Genitourinary: Negative for dysuria.  Musculoskeletal: Positive for neck pain.  Neurological: Positive for headaches. Negative for speech difficulty and weakness.  All other systems reviewed and are negative.    Physical Exam Updated Vital Signs BP (!) 145/84   Pulse 64  Temp 97.8 F (36.6 C) (Oral)   Resp 16   Ht 1.829 m (6')   Wt 77.1 kg   LMP 01/01/2012   SpO2 100%   BMI 23.06 kg/m   Physical Exam  Constitutional: She is oriented to person, place, and time. She appears well-developed and well-nourished. No distress.  HENT:  Head: Normocephalic and atraumatic.  Eyes: Pupils are equal, round, and reactive to light.  Neck: Normal range of motion. Neck supple.  No meningismus  Cardiovascular: Normal rate, regular rhythm and normal heart sounds.  Pulmonary/Chest: Effort normal. No respiratory  distress. She has no wheezes.  Abdominal: Soft. Normal appearance and bowel sounds are normal. There is no tenderness. There is no rebound and no guarding.  Musculoskeletal:  Tenderness palpation right neck and shoulder region over the rhomboid, no overlying skin changes  Neurological: She is alert and oriented to person, place, and time.  Cranial nerves II through XII intact, 5 out of 5 strength in all 4 extremities  Skin: Skin is warm and dry.  Psychiatric: She has a normal mood and affect.  Nursing note and vitals reviewed.    ED Treatments / Results  Labs (all labs ordered are listed, but only abnormal results are displayed) Labs Reviewed  COMPREHENSIVE METABOLIC PANEL - Abnormal; Notable for the following components:      Result Value   Glucose, Bld 124 (*)    All other components within normal limits  LIPASE, BLOOD  CBC  URINALYSIS, ROUTINE W REFLEX MICROSCOPIC  I-STAT BETA HCG BLOOD, ED ([facility], [facility], [facility] ONLY)    EKG None  Radiology No results found.  Procedures Procedures (including critical care time)  Medications Ordered in ED Medications  alum & mag hydroxide-simeth (MAALOX/MYLANTA) 200-200-20 MG/5ML suspension 30 mL (30 mLs Oral Given 03/25/18 0323)  ondansetron (ZOFRAN) injection 4 mg (4 mg Intravenous Given 03/25/18 0323)  famotidine (PEPCID) IVPB 20 mg premix (0 mg Intravenous Stopped 03/25/18 0400)  diazepam (VALIUM) tablet 5 mg (5 mg Oral Given 03/25/18 0324)     Initial Impression / Assessment and Plan / ED Course  I have reviewed the triage vital signs and the nursing notes.  Pertinent labs & imaging results that were available during my care of the patient were reviewed by me and considered in my medical decision making (see chart for details).     Patient presents with several complaints.  Predominantly abdominal discomfort and nausea.  She is a daily drinker.  She is overall nontoxic-appearing vital signs are reassuring.  No significant abdominal  tenderness on exam.  Additionally she has some right-sided muscular tenderness of the neck and associated headache.  She is neurologically intact.  Patient was given a GI cocktail, Zofran, Valium.  On recheck however she states she feels much better.  She is able to tolerate fluids.  Suspect she may have some gastritis.  She may also has some musculoskeletal pain over the right side of the neck.  Recommend supportive measures.  GI follow-up provided.  Encourage patient to cut back drinking as this is likely contributory.  After history, exam, and medical workup I feel the patient has been appropriately medically screened and is safe for discharge home. Pertinent diagnoses were discussed with the patient. Patient was given return precautions.  Final Clinical Impressions(s) / ED Diagnoses   Final diagnoses:  Epigastric pain    ED Discharge Orders         Ordered    omeprazole (PRILOSEC) 20 MG capsule  Daily  03/25/18 2956           Shon Baton, MD 03/25/18 [phone]

## 2018-03-25 NOTE — ED Triage Notes (Signed)
Pt reports abdominal pain and nausea X 2 days.  Pt reports fatigue, chills, generalized aches.

## 2018-03-25 NOTE — ED Notes (Signed)
Patient given crackers and PO fluids °

## 2018-03-27 ENCOUNTER — Ambulatory Visit (HOSPITAL_COMMUNITY)
Admission: EM | Admit: 2018-03-27 | Discharge: 2018-03-27 | Disposition: A | Discharge status: Home / Self Care | Payer: Commercial Managed Care - PPO | Attending: Family Medicine | Admitting: Family Medicine

## 2018-03-27 ENCOUNTER — Encounter (HOSPITAL_COMMUNITY): Payer: Self-pay

## 2018-03-27 ENCOUNTER — Ambulatory Visit (INDEPENDENT_AMBULATORY_CARE_PROVIDER_SITE_OTHER): Payer: Commercial Managed Care - PPO

## 2018-03-27 DIAGNOSIS — M25462 Effusion, left knee: Secondary | ICD-10-CM

## 2018-03-27 DIAGNOSIS — B372 Candidiasis of skin and nail: Secondary | ICD-10-CM

## 2018-03-27 DIAGNOSIS — M25562 Pain in left knee: Secondary | ICD-10-CM | POA: Diagnosis not present

## 2018-03-27 MED ORDER — METHYLPREDNISOLONE 4 MG PO TBPK: ORAL_TABLET | ORAL | 0 refills | Status: DC

## 2018-03-27 MED ORDER — NYSTATIN 100000 UNIT/GM EX CREA: TOPICAL_CREAM | CUTANEOUS | 0 refills | Status: AC

## 2018-03-27 NOTE — Discharge Instructions (Signed)
Use the cream on the rash until clears  Take the medrol pak as instructed Take all of day one today Try to stay off your feet a couple of days Wear brace when you're up and walking Follow up with a PCP

## 2018-03-27 NOTE — ED Provider Notes (Signed)
[facility]    CSN: [id number] Arrival date & time: 03/27/18  1310     History   Chief Complaint Chief Complaint  Patient presents with  . Knee Pain    HPI Lisa Woodward is a 47 y.o. female.   HPI  Knee pain for 2 weeks.  She has had L and R  Knee pain over the years.  Findings suspicious for arthritis.  Never seen ortho or PCP or had x rays.  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   Denies trauma, recent or remote.  Denies family history OA. NO buckling or locking.  Now has swelling.  Also has a loud grinding sound with movement  Has tried ibuprofen.  Has tried naprosyn   No lasting relief. No injections or PT  Also has rash in groin to look at.  mildly itchy.  Spreading.  History reviewed. No pertinent past medical history.  There are no active problems to display for this patient.   Past Surgical History:  Procedure Laterality Date  . CHOLECYSTECTOMY      OB History   None      Home Medications    Prior to Admission medications   Medication Sig Start Date End Date Taking? Authorizing Provider  albuterol (PROVENTIL HFA;VENTOLIN HFA) 108 (90 BASE) MCG/ACT inhaler Inhale 2 puffs into the lungs every 4 (four) hours as needed for wheezing or shortness of breath. 07/04/14   Hayden RasmussenMabe, David, NP  Aspirin-Salicylamide-Caffeine (BC HEADACHE POWDER PO) Take 1 packet by mouth daily as needed (pain).     [provider]  methylPREDNISolone (MEDROL DOSEPAK) 4 MG TBPK tablet tad 03/27/18   Eustace MooreNelson, Hancel Ion Sue, MD  nystatin cream (MYCOSTATIN) Apply to affected area 2 times daily 03/27/18   Eustace MooreNelson, Coleson Kant Sue, MD  Omega-3 Fatty Acids (FISH OIL PO) Take 1 capsule by mouth daily.    [provider]  omeprazole (PRILOSEC) 20 MG capsule Take 1 capsule (20 mg total) by mouth daily. 03/25/18   Horton, Mayer Maskerourtney F, MD  triamcinolone (NASACORT) 55 MCG/ACT AERO nasal inhaler Place 2 sprays into the nose daily. 09/08/17   Isa RankinMurray, Laura Wilson, MD     Family History No family history on file.  Social History Social History   Tobacco Use  . Smoking status: Never Smoker  . Smokeless tobacco: Never Used  Substance Use Topics  . Alcohol use: No  . Drug use: No     Allergies   Patient has no known allergies.   Review of Systems Review of Systems  Constitutional: Negative for chills and fever.  HENT: Negative for ear pain and sore throat.   Eyes: Negative for pain and visual disturbance.  Respiratory: Negative for cough and shortness of breath.   Cardiovascular: Negative for chest pain and palpitations.  Gastrointestinal: Negative for abdominal pain and vomiting.  Genitourinary: Negative for dysuria and hematuria.  Musculoskeletal: Positive for arthralgias and gait problem. Negative for back pain.  Skin: Positive for rash. Negative for color change.  Neurological: Negative for seizures and syncope.  Psychiatric/Behavioral: Negative for sleep disturbance. The patient is not nervous/anxious.   All other systems reviewed and are negative.    Physical Exam Triage Vital Signs ED Triage Vitals  Enc Vitals Group     BP 03/27/18 1342 129/88     Pulse Rate 03/27/18 1342 74     Resp 03/27/18 1342 19     Temp 03/27/18 1342 97.9 F (36.6 C)  Temp src --      SpO2 03/27/18 1342 100 %     Weight --      Height --      Head Circumference --      Peak Flow --      Pain Score 03/27/18 1343 6     Pain Loc --      Pain Edu? --      Excl. in GC? --    No data found.  Updated Vital Signs BP 129/88   Pulse 74   Temp 97.9 F (36.6 C)   Resp 19   LMP 01/01/2012   SpO2 100%      Physical Exam  Constitutional: She appears well-developed and well-nourished. No distress.  antalgic gait  HENT:  Head: Normocephalic and atraumatic.  Mouth/Throat: Oropharynx is clear and moist.  Eyes: Pupils are equal, round, and reactive to light. Conjunctivae are normal.  Neck: Normal range of motion.  Cardiovascular: Normal rate.   Pulmonary/Chest: Effort normal. No respiratory distress.  Abdominal: Soft. She exhibits no distension.  Musculoskeletal: Normal range of motion. She exhibits no edema.  L knee with FROM.  Pronounced patelofemoral crepitus.  +effusion and warmth.  No instability.  Neurological: She is alert.  Skin: Skin is warm and dry.  Erythematous papular lesions with scale and satellite lesions both groin areas     UC Treatments / Results  Labs (all labs ordered are listed, but only abnormal results are displayed) Labs Reviewed - No data to display  EKG None  Radiology Dg Knee Ap/lat W/sunrise Left  Result Date: 03/27/2018 CLINICAL DATA:  47 y/o F; 2 years of chronic left knee pain with swelling. EXAM: LEFT KNEE 3 VIEWS COMPARISON:  None. FINDINGS: Mild loss of medial femorotibial compartment joint space. Spurring of the tibial spines. No joint effusion, fracture, or dislocation. IMPRESSION: 1.  No acute fracture or dislocation identified. 2. 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 Electronically Signed   By: Mitzi Hansen M.D.   On: 03/27/2018 14:31    Procedures Procedures (including critical care time)  Medications Ordered in UC Medications - No data to display  Initial Impression / Assessment and Plan / UC Course  I have reviewed the triage vital signs and the nursing notes.  Pertinent labs & imaging results that were available during my care of the patient were reviewed by me and considered in my medical decision making (see chart for details).     Reviewed conservative management of her arthritis of the knee.  Anti-inflammatories.  Ice.  Braces.  Activity limitations when painful.  Exercises and physical therapy.  Injections.  Referral to orthopedic surgery.  All this can be orchestrated through her PCP.  PCP is recommended. Final Clinical Impressions(s) / UC Diagnoses   Final diagnoses:  Acute pain of left knee   Effusion of left knee  Candidal intertrigo     Discharge Instructions     Use the cream on the rash until clears  Take the medrol pak as instructed Take all of day one today Try to stay off your feet a couple of days Wear brace when you're up and walking Follow up with a PCP     ED Prescriptions    Medication Sig Dispense Auth. Provider   methylPREDNISolone (MEDROL DOSEPAK) 4 MG TBPK tablet tad 21 tablet Eustace Moore, MD   nystatin cream (MYCOSTATIN) Apply to affected area 2 times daily 30 g Eustace Moore,  MD     Controlled Substance Prescriptions [place] Controlled Substance Registry consulted? Not Applicable   Eustace Moore, MD 03/27/18 [phone]

## 2018-03-27 NOTE — ED Triage Notes (Signed)
Pt presents with complaints of knee pain that is worse with ambulation.

## 2018-08-21 ENCOUNTER — Encounter (HOSPITAL_COMMUNITY): Payer: Self-pay | Admitting: Emergency Medicine

## 2018-08-21 ENCOUNTER — Emergency Department (HOSPITAL_COMMUNITY): Payer: Commercial Managed Care - PPO

## 2018-08-21 ENCOUNTER — Emergency Department (HOSPITAL_COMMUNITY)
Admission: EM | Admit: 2018-08-21 | Discharge: 2018-08-21 | Disposition: A | Discharge status: Home / Self Care | Payer: Commercial Managed Care - PPO | Attending: Emergency Medicine | Admitting: Emergency Medicine

## 2018-08-21 ENCOUNTER — Other Ambulatory Visit: Payer: Self-pay

## 2018-08-21 DIAGNOSIS — R0789 Other chest pain: Secondary | ICD-10-CM | POA: Diagnosis present

## 2018-08-21 DIAGNOSIS — R072 Precordial pain: Secondary | ICD-10-CM | POA: Diagnosis not present

## 2018-08-21 LAB — CBC
HCT: 39.8 % (ref 36.0–46.0)
Hemoglobin: 12.4 g/dL (ref 12.0–15.0)
MCH: 30.2 pg (ref 26.0–34.0)
MCHC: 31.2 g/dL (ref 30.0–36.0)
MCV: 96.8 fL (ref 80.0–100.0)
Platelets: 284 10*3/uL (ref 150–400)
RBC: 4.11 MIL/uL (ref 3.87–5.11)
RDW: 13.2 % (ref 11.5–15.5)
WBC: 6.3 10*3/uL (ref 4.0–10.5)
nRBC: 0 % (ref 0.0–0.2)

## 2018-08-21 LAB — BASIC METABOLIC PANEL
Anion gap: 8 (ref 5–15)
BUN: 12 mg/dL (ref 6–20)
CO2: 26 mmol/L (ref 22–32)
Calcium: 9.4 mg/dL (ref 8.9–10.3)
Chloride: 105 mmol/L (ref 98–111)
Creatinine, Ser: 0.89 mg/dL (ref 0.44–1.00)
GFR calc Af Amer: >60 mL/min (ref >60)
GFR calc non Af Amer: >60 mL/min (ref >60)
Glucose, Bld: 96 mg/dL (ref 70–99)
Potassium: 3.5 mmol/L (ref 3.5–5.1)
Sodium: 139 mmol/L (ref 135–145)

## 2018-08-21 LAB — I-STAT BETA HCG BLOOD, ED (MC, WL, AP ONLY): I-stat hCG, quantitative: <5 m[IU]/mL (ref <5)

## 2018-08-21 LAB — TROPONIN I: Troponin I: <0.03 ng/mL (ref <0.03)

## 2018-08-21 MED ORDER — DICLOFENAC SODIUM 1 % TD GEL: 2.0000 g | Freq: Four times a day (QID) | TRANSDERMAL | 0 refills | Status: AC | PRN

## 2018-08-21 NOTE — ED Provider Notes (Signed)
Emergency Department Provider Note   I have reviewed the triage vital signs and the nursing notes.   HISTORY  Chief Complaint Chest Pain   HPI Lisa Woodward is a 48 y.o. female with no significant PMH presents to the emergency department with chest tightness.  Patient states this feels like a soreness and is significantly worse with any movement.  She does report some pain with breathing but does not feel short of breath.  She states that anytime she moves her arms or bends her torso she feels tightness in the chest.  She does have some mild discomfort at rest.  Patient also feels discomfort with pressing on the chest.  She has no prior history of DVT/PE.  Patient states that she did begin walking with a weight on her left leg which is also painful.  She states that yesterday she was carrying very heavy bags of groceries home from the store and that her discomfort began shortly after.  She denies any fever, chills, infection symptoms.  No cough, congestion, shortness of breath.  No radiation of her symptoms or other modifying factors.  History reviewed. No pertinent past medical history.  There are no active problems to display for this patient.   Past Surgical History:  Procedure Laterality Date  . CHOLECYSTECTOMY      Allergies Patient has no known allergies.  No family history on file.  Social History Social History   Tobacco Use  . Smoking status: Never Smoker  . Smokeless tobacco: Never Used  Substance Use Topics  . Alcohol use: No  . Drug use: No    Review of Systems  Constitutional: No fever/chills Eyes: No visual changes. ENT: No sore throat. Cardiovascular: Positive chest pain. Respiratory: Denies shortness of breath. Gastrointestinal: No abdominal pain.  No nausea, no vomiting.  No diarrhea.  No constipation. Genitourinary: Negative for dysuria. Musculoskeletal: Negative for back pain. Left leg soreness.  Skin: Negative for rash. Neurological:  Negative for headaches, focal weakness or numbness.  10-point ROS otherwise negative.  ____________________________________________   PHYSICAL EXAM:  VITAL SIGNS: ED Triage Vitals  Enc Vitals Group     BP 08/21/18 1001 (!) 141/77     Pulse Rate 08/21/18 1001 82     Resp 08/21/18 1001 17     Temp 08/21/18 1001 97.6 F (36.4 C)     Temp Source 08/21/18 1001 Oral     SpO2 08/21/18 1001 99 %     Weight 08/21/18 1001 170 lb (77.1 kg)     Height 08/21/18 1001 6' (1.829 m)     Pain Score 08/21/18 1005 5    Constitutional: Alert and oriented. Well appearing and in no acute distress. Eyes: Conjunctivae are normal. Head: Atraumatic. Nose: No congestion/rhinnorhea. Mouth/Throat: Mucous membranes are moist. Neck: No stridor.  Cardiovascular: Normal rate, regular rhythm. Good peripheral circulation. Grossly normal heart sounds.   Respiratory: Normal respiratory effort.  No retractions. Lungs CTAB. Gastrointestinal: Soft and nontender. No distention.  Musculoskeletal: No lower extremity tenderness nor edema. No gross deformities of extremities. Patient wincing and withdrawing from palpation of the anterior chest wall.  Neurologic:  Normal speech and language. No gross focal neurologic deficits are appreciated.  Skin:  Skin is warm, dry and intact. No rash noted.  ____________________________________________   LABS (all labs ordered are listed, but only abnormal results are displayed)  Labs Reviewed  BASIC METABOLIC PANEL  CBC  TROPONIN I  I-STAT BETA HCG BLOOD, ED ([facility], [facility], [facility] ONLY)  ____________________________________________  EKG   EKG Interpretation  Date/Time:  Friday Aug 21 2018 10:03:55 EDT Ventricular Rate:  68 PR Interval:  154 QRS Duration: 74 QT Interval:  406 QTC Calculation: 431 R Axis:   71 Text Interpretation:  Normal sinus rhythm Normal ECG No STEMI. Similar to 2013 tracing.  Confirmed by Alona Bene [phone]) on 08/21/2018 11:05:14 AM        ____________________________________________  RADIOLOGY  Dg Chest 2 View  Result Date: 08/21/2018 CLINICAL DATA:  Chest pain. EXAM: CHEST - 2 VIEW COMPARISON:  None. FINDINGS: The heart is at the upper limits of normal in size. Normal mediastinal contours. Normal pulmonary vascularity. No focal consolidation, pleural effusion, or pneumothorax. No acute osseous abnormality. Prior cholecystectomy. IMPRESSION: No active cardiopulmonary disease. Electronically Signed   By: Obie Dredge M.D.   On: 08/21/2018 11:27    ____________________________________________   PROCEDURES  Procedure(s) performed:   Procedures  None  ____________________________________________   INITIAL IMPRESSION / ASSESSMENT AND PLAN / ED COURSE  Pertinent labs & imaging results that were available during my care of the patient were reviewed by me and considered in my medical decision making (see chart for details).   Patient presents to the emergency department with chest pain that is most consistent with musculoskeletal etiology.  She withdrawals from palpation of the anterior chest describes this is reproducing her chest pain exactly.  Symptoms began after carrying heavy groceries from the store which she does not typically do.  She had EKG, labs, chest x-ray ordered from triage which were reviewed and showed no acute abnormalities.  Her vital signs are unremarkable including heart rate and oxygen saturation.  My suspicion for acute coronary syndrome is exceedingly low.  Patient is low risk overall by HEART score.  Patient is also low risk for PE by Wells and PERC negative.  Plan for supportive care at home.  Will prescribe Voltaren gel and advised to take Tylenol and/or Motrin as needed for muscle soreness.  Provided contact information for primary care provider and advised that she follow-up in the coming week to review her ED visit and schedule an appointment if her symptoms continue.  Discussed ED return  precautions in detail.    ____________________________________________  FINAL CLINICAL IMPRESSION(S) / ED DIAGNOSES  Final diagnoses:  Precordial chest pain    NEW OUTPATIENT MEDICATIONS STARTED DURING THIS VISIT:  New Prescriptions   DICLOFENAC SODIUM (VOLTAREN) 1 % GEL    Apply 2 g topically 4 (four) times daily as needed for up to 5 days (muscle pain).    Note:  This document was prepared using Dragon voice recognition software and may include unintentional dictation errors.  Alona Bene, MD Emergency Medicine    Long, Arlyss Repress, MD 08/21/18 [phone]

## 2018-08-21 NOTE — ED Triage Notes (Signed)
Pt in with central cp beginning yesterday. States pain is sharp and worse with movement. Denies sob, n/v, cough or fever.

## 2018-08-21 NOTE — ED Notes (Signed)
Patient verbalizes understanding of discharge instructions. Opportunity for questioning and answers were provided. Armband removed by staff, pt discharged from ED ambulatory by self\  

## 2018-08-21 NOTE — Discharge Instructions (Signed)
We believe that your symptoms are caused by musculoskeletal strain.  Please read through the included information about additional care such as heating pads, over-the-counter pain medicine.  If you were provided a prescription please use it only as needed and as instructed.  Remember that early mobility and using the affected part of your body is actually better than keeping it immobile.  If your chest pain suddenly worsens or you develop chest pain, fever, or lightheadedness you should return to the ED immediately.   Follow-up with the doctor listed as recommended or return to the emergency department with new or worsening symptoms that concern you.

## 2019-01-12 IMAGING — DX DG KNEE AP/LAT W/ SUNRISE*L*
3 series · 3 of 3 positions shown · non-contrast
Comparison: None.

CLINICAL DATA: 47 y/o F; 2 years of chronic left knee pain with
swelling.

EXAM:
LEFT KNEE 3 VIEWS

[knee ap]
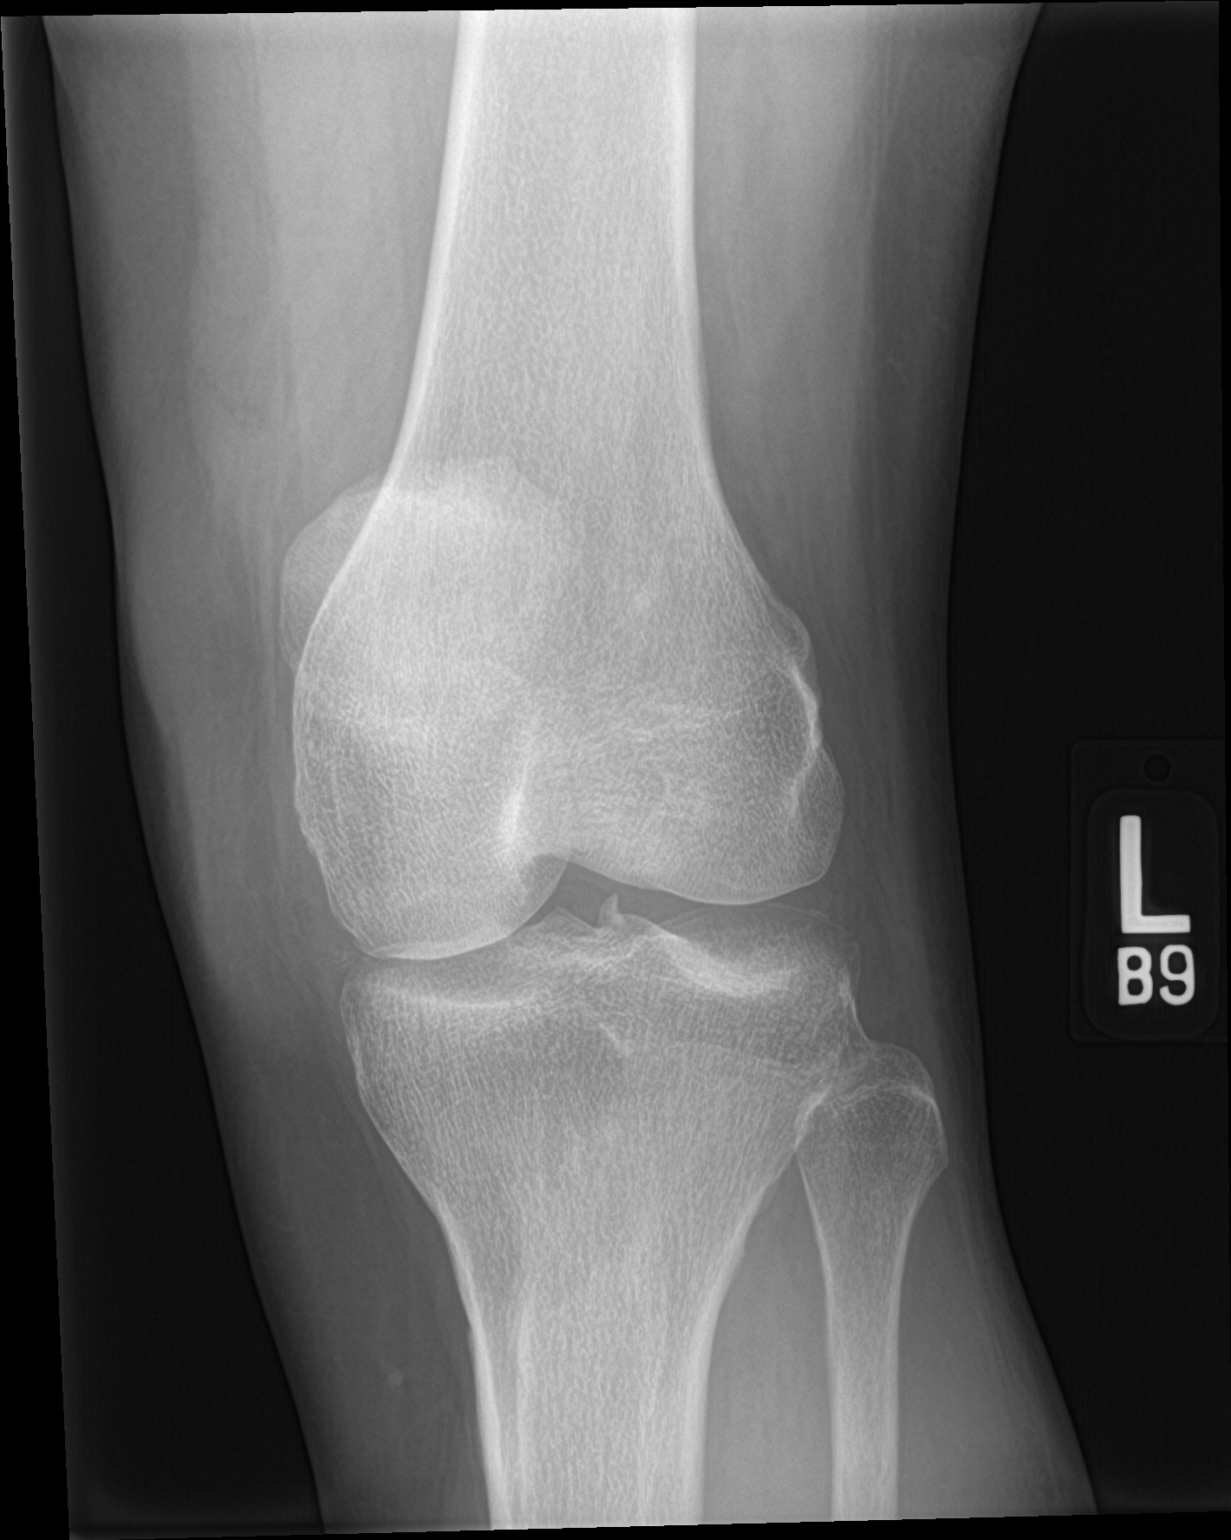

[knee lat]
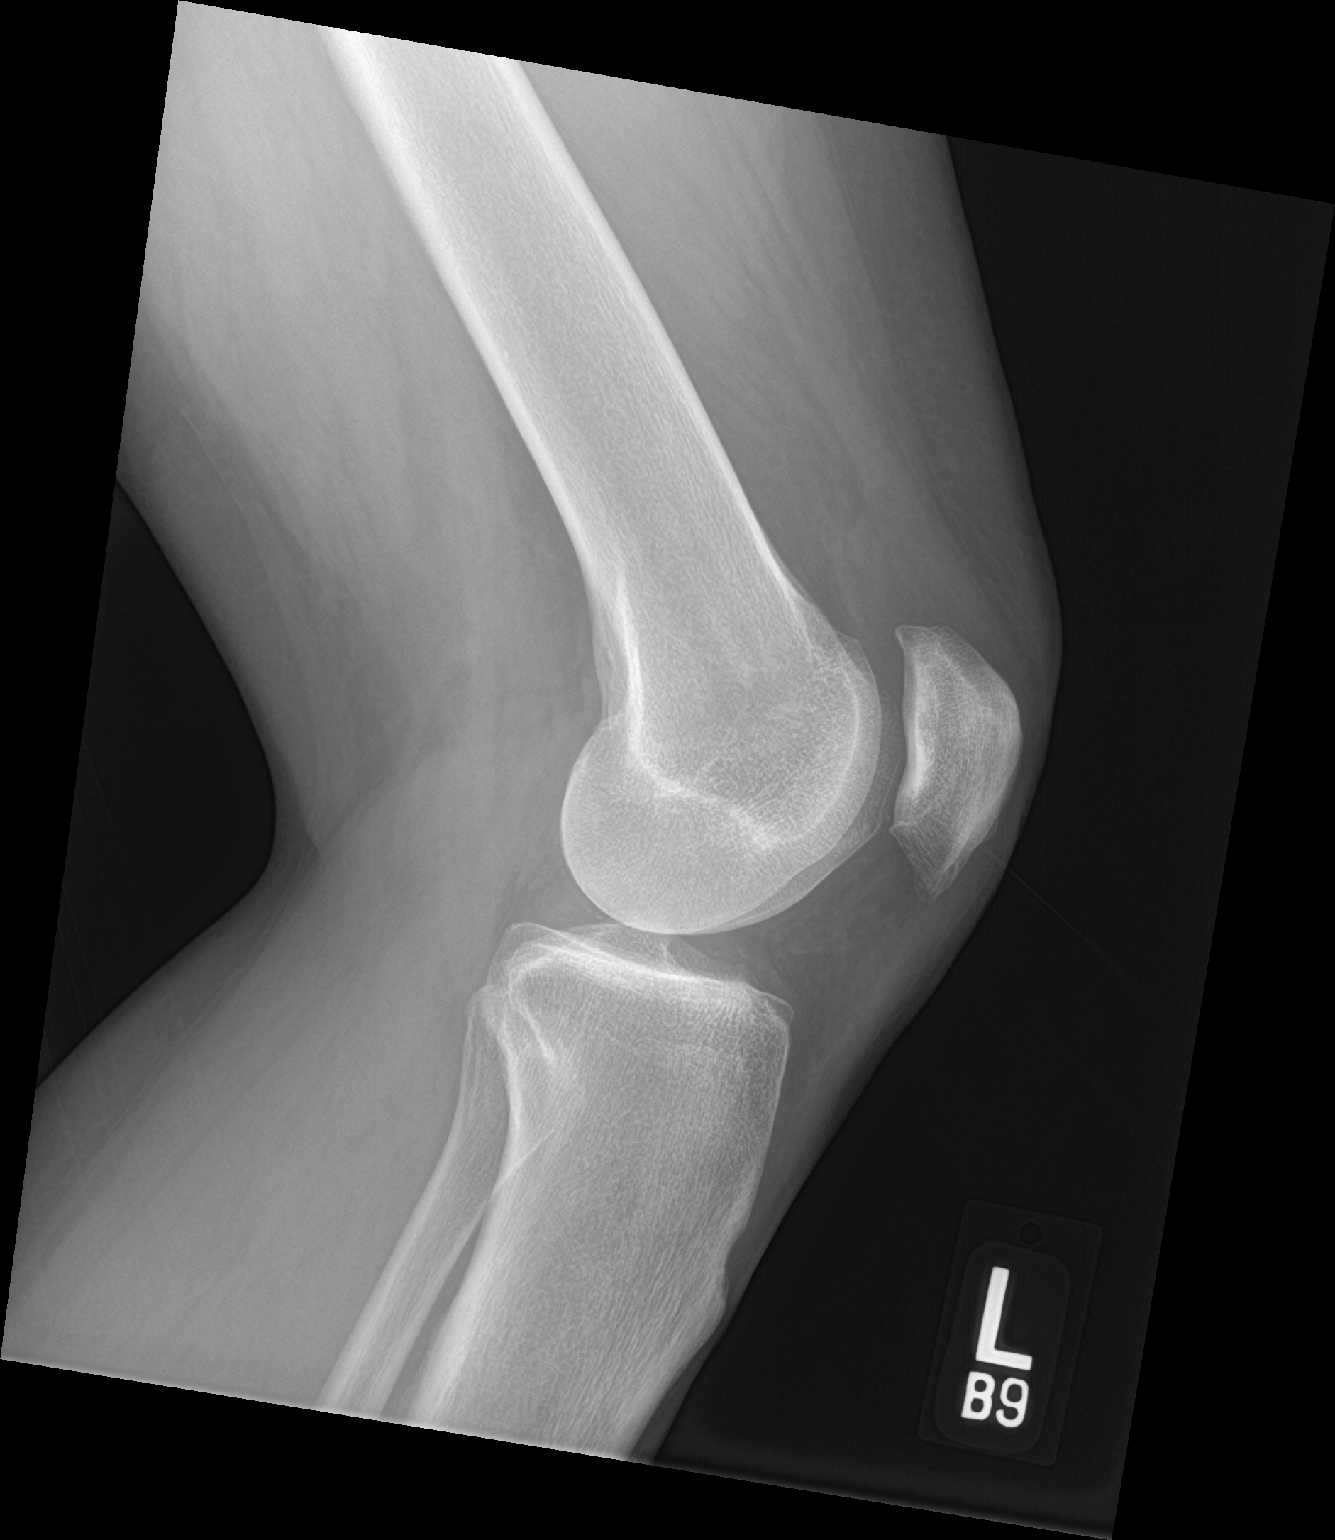

[knee sunrise]
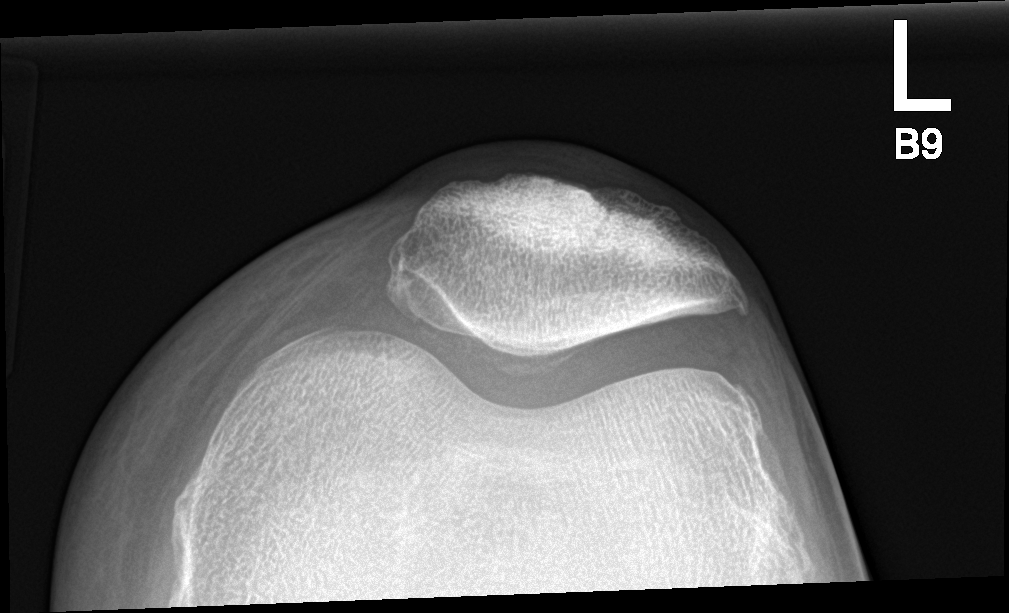

[3 of 3 positions shown; findings below may reference images not displayed]

FINDINGS: Mild loss of medial femorotibial compartment joint space. Spurring
of the tibial spines. No joint effusion, fracture, or dislocation.
IMPRESSION: 1.  No acute fracture or dislocation identified.
2. Osteoarthrosis of the knee with mild loss of medial femorotibial
compartment joint space.

## 2019-06-08 IMAGING — DX CHEST - 2 VIEW
2 series · 2 of 2 positions shown · non-contrast
Comparison: None.

CLINICAL DATA: Chest pain.

EXAM:
CHEST - 2 VIEW

[chest pa]
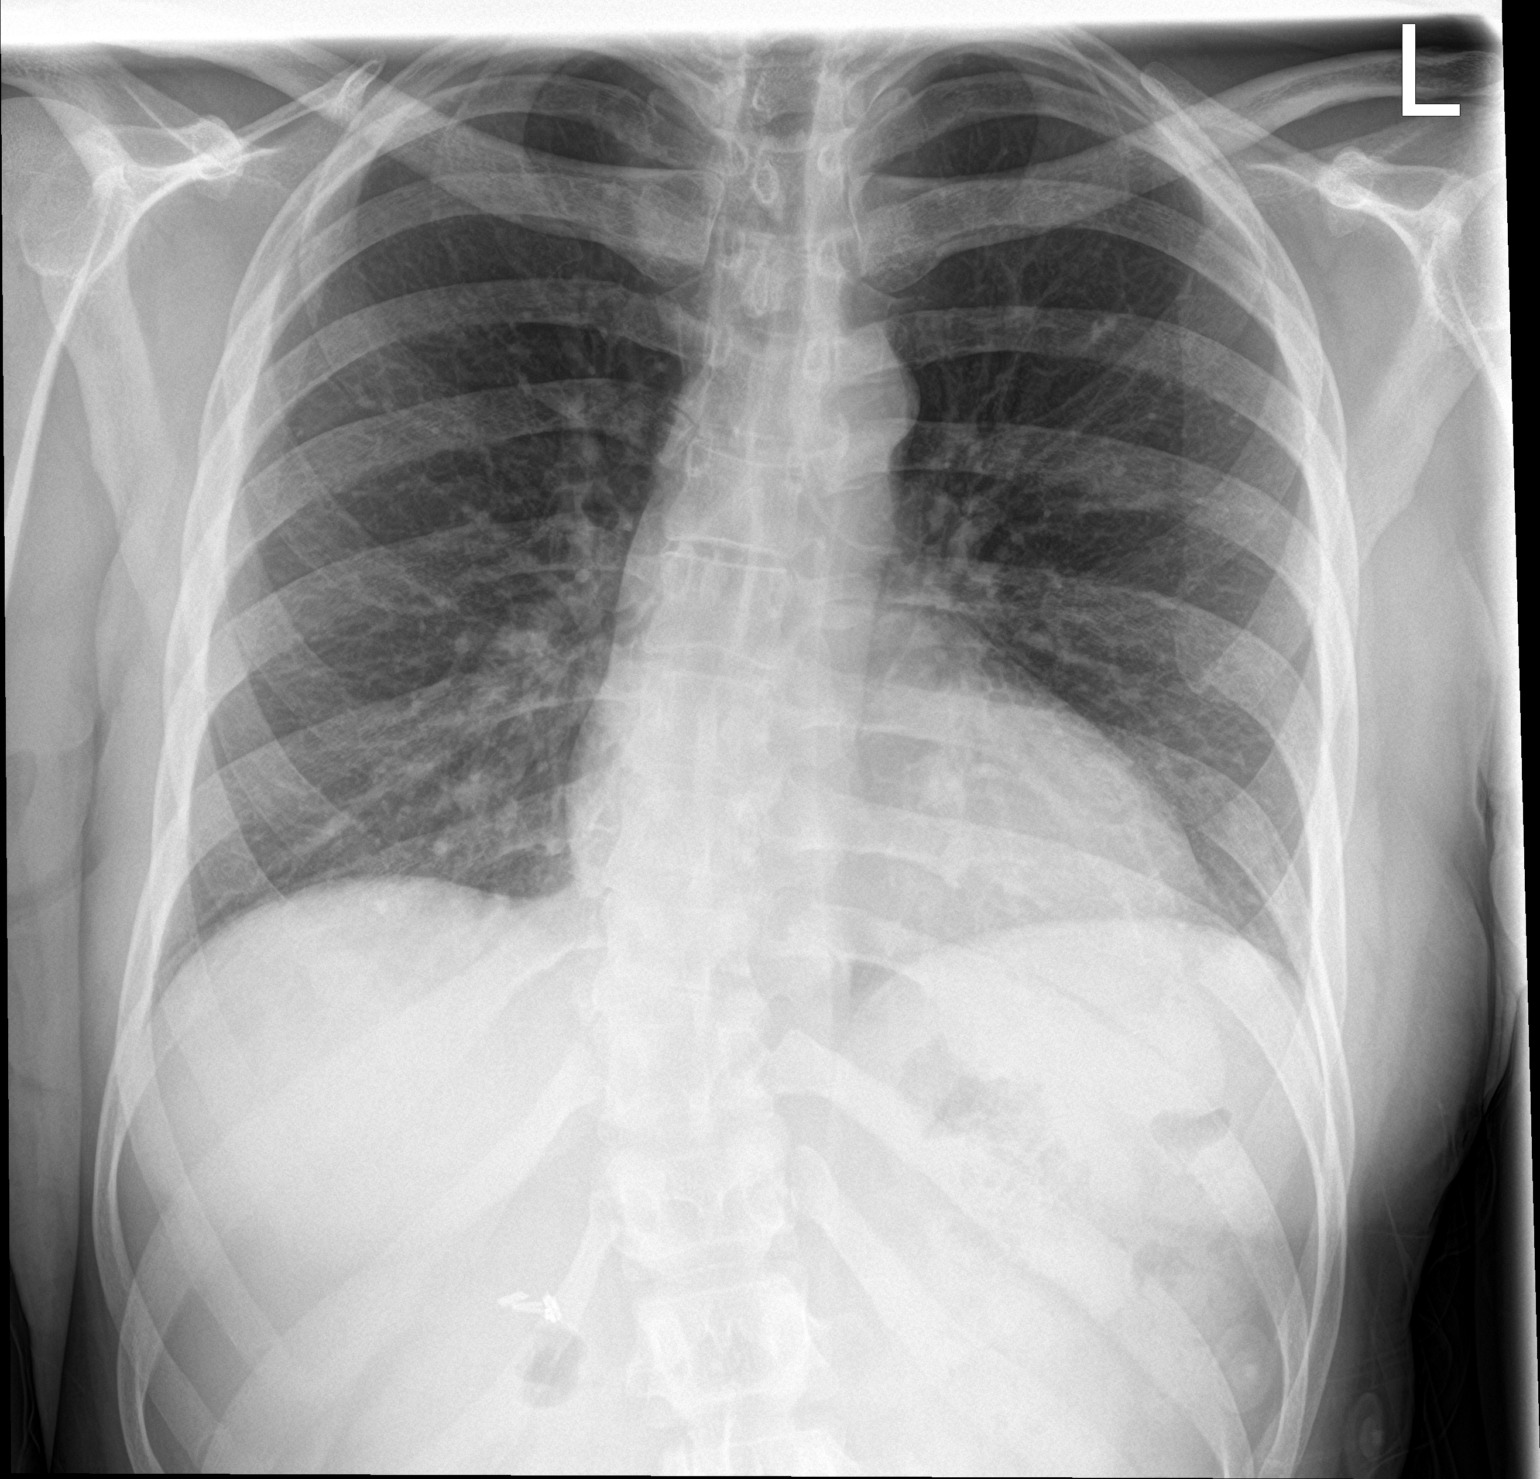

[chest lat]
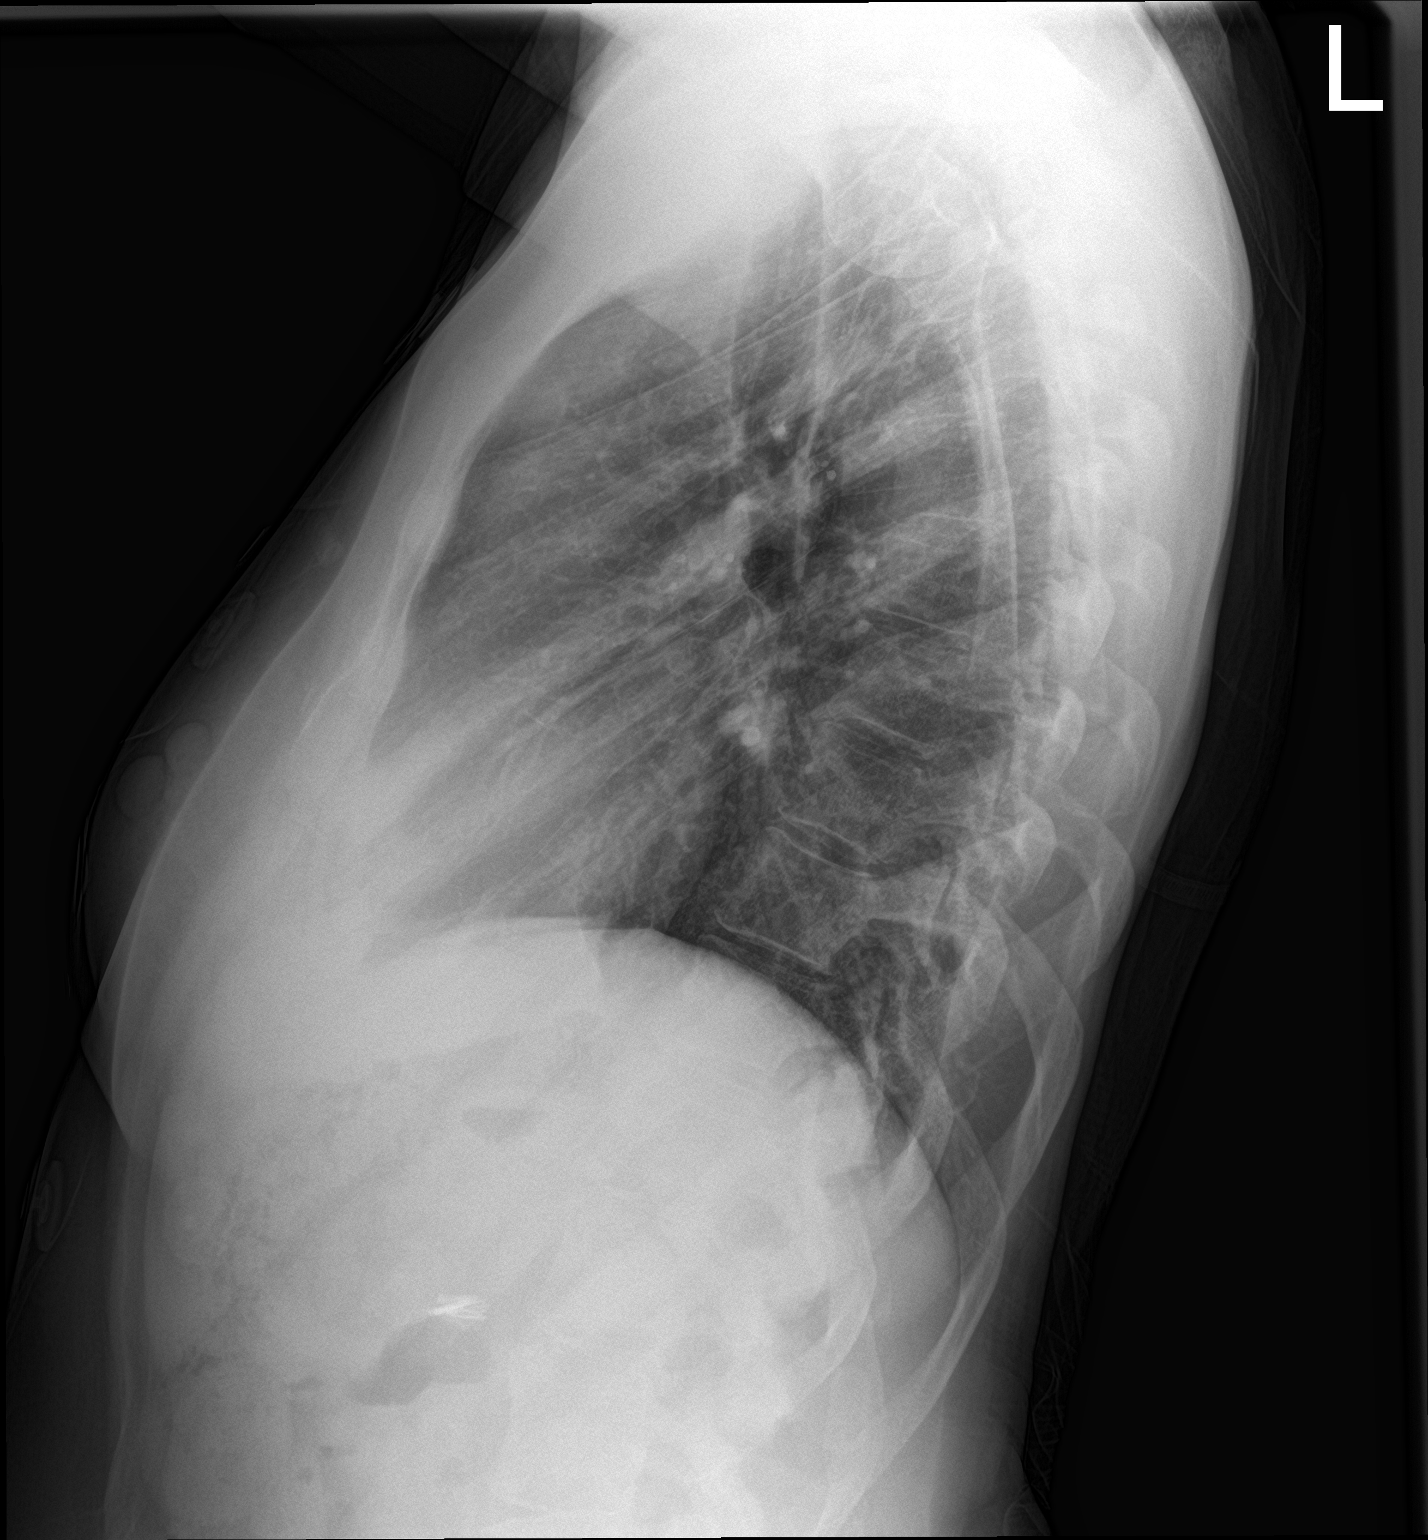

[2 of 2 positions shown; findings below may reference images not displayed]

FINDINGS: The heart is at the upper limits of normal in size. Normal
mediastinal contours. Normal pulmonary vascularity. No focal
consolidation, pleural effusion, or pneumothorax. No acute osseous
abnormality. Prior cholecystectomy.
IMPRESSION: No active cardiopulmonary disease.

## 2019-10-08 ENCOUNTER — Other Ambulatory Visit: Payer: Self-pay

## 2019-10-08 ENCOUNTER — Ambulatory Visit
Admission: EM | Admit: 2019-10-08 | Discharge: 2019-10-08 | Disposition: A | Discharge status: Home / Self Care | Payer: Commercial Managed Care - PPO | Attending: Emergency Medicine | Admitting: Emergency Medicine

## 2019-10-08 DIAGNOSIS — K219 Gastro-esophageal reflux disease without esophagitis: Secondary | ICD-10-CM | POA: Diagnosis not present

## 2019-10-08 DIAGNOSIS — R0789 Other chest pain: Secondary | ICD-10-CM | POA: Diagnosis not present

## 2019-10-08 DIAGNOSIS — S46812A Strain of other muscles, fascia and tendons at shoulder and upper arm level, left arm, initial encounter: Secondary | ICD-10-CM

## 2019-10-08 LAB — POCT URINALYSIS DIP (MANUAL ENTRY)
Bilirubin, UA: NEGATIVE
Glucose, UA: NEGATIVE mg/dL
Ketones, POC UA: NEGATIVE mg/dL
Leukocytes, UA: NEGATIVE
Nitrite, UA: NEGATIVE
Protein Ur, POC: NEGATIVE mg/dL
Spec Grav, UA: >=1.03 — AB (ref 1.010–1.025)
Urobilinogen, UA: 0.2 E.U./dL
pH, UA: 5.5 (ref 5.0–8.0)

## 2019-10-08 MED ORDER — NAPROXEN 500 MG PO TABS: 500.0000 mg | ORAL_TABLET | Freq: Two times a day (BID) | ORAL | 0 refills | Status: DC

## 2019-10-08 MED ORDER — OMEPRAZOLE 20 MG PO CPDR: 20.0000 mg | DELAYED_RELEASE_CAPSULE | Freq: Every day | ORAL | 0 refills | Status: DC

## 2019-10-08 MED ORDER — CYCLOBENZAPRINE HCL 5 MG PO TABS: 5.0000 mg | ORAL_TABLET | Freq: Two times a day (BID) | ORAL | 0 refills | Status: AC | PRN

## 2019-10-08 NOTE — ED Triage Notes (Signed)
Pt c/o left CP radiating to left jaw and left upper back intermittently for approx 3 weeks while at rest-pt states it has awoken her at night several times. Pt describes CP as "heaviness" and has been experiencing intermittent lightheadedness, "sweatiness", nausea and mild SOBOE.  Pt states "it seems the [facility] powder was making my chest hurt worse, so stopped taking them about 1.5 months ago".  Also c/o pain to LUQ abdomen for approx 2 weeks.   Pt reports pain increases to upper left chest area with palpation. Also reports diarrhea for approx 2 days and "dark" urine.

## 2019-10-08 NOTE — Discharge Instructions (Signed)
Recommend RICE: rest, ice, compression, elevation as needed for pain.    Heat therapy (hot compress, warm wash rag, hot showers, etc.) can help relax muscles and soothe muscle aches. Cold therapy (ice packs) can be used to help swelling both after injury and after prolonged use of areas of chronic pain/aches.  For pain: Naproxen twice daily with food. May take Tylenol additionally if needed.  May take muscle relaxer as needed for severe pain / spasm.  (This medication may cause you to become tired so it is important you do not drink alcohol or operate heavy machinery while on this medication.  Recommend your first dose to be taken before bedtime to monitor for side effects safely)

## 2019-10-08 NOTE — ED Provider Notes (Signed)
[facility]    CSN: [id number] Arrival date & time: 10/08/19  1523      History   Chief Complaint Chief Complaint  Patient presents with  . Abdominal Pain  . Chest Pain    HPI Lisa Woodward is a 49 y.o. female with history of cholecystectomy, GERD presenting left-sided chest pain and left trapezius pain for the last 2-3 weeks.  States sometimes radiate up into her jaw or down left back.  Worse with certain positions.  States it is woken her at sleep with a tight or heavy sensation.  Feels like sometimes she cannot take a full breath due to pain.  No nausea, vomiting, abdominal pain, palpitations, dizziness.  Denies trauma to area.  Patient is right-handed.  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  Does so with her left hand reaching up and over container and pulling up, right to support.  No numbness, weakness.   History reviewed. No pertinent past medical history.  There are no problems to display for this patient.   Past Surgical History:  Procedure Laterality Date  . CHOLECYSTECTOMY    . HAND SURGERY      OB History   No obstetric history on file.      Home Medications    Prior to Admission medications   Medication Sig Start Date End Date Taking? Authorizing Provider  albuterol (PROVENTIL HFA;VENTOLIN HFA) 108 (90 BASE) MCG/ACT inhaler Inhale 2 puffs into the lungs every 4 (four) hours as needed for wheezing or shortness of breath. 07/04/14   Janne Napoleon, NP  Aspirin-Salicylamide-Caffeine (BC HEADACHE POWDER PO) Take 1 packet by mouth daily as needed (pain).     [provider]  cyclobenzaprine (FLEXERIL) 5 MG tablet Take 1 tablet (5 mg total) by mouth 2 (two) times daily as needed for up to 5 days for muscle spasms. 10/08/19 10/13/19  Hall-Potvin, [place], PA-C  naproxen (NAPROSYN) 500 MG tablet Take 1 tablet (500 mg total) by mouth 2 (two) times daily. 10/08/19   Hall-Potvin, [place], PA-C    nystatin cream (MYCOSTATIN) Apply to affected area 2 times daily 03/27/18   Raylene Everts, MD  Omega-3 Fatty Acids (FISH OIL PO) Take 1 capsule by mouth daily.    [provider]  omeprazole (PRILOSEC) 20 MG capsule Take 1 capsule (20 mg total) by mouth daily. 10/08/19   Hall-Potvin, [place], PA-C  triamcinolone (NASACORT) 55 MCG/ACT AERO nasal inhaler Place 2 sprays into the nose daily. 09/08/17   Wynona Luna, MD    Family History Family History  Problem Relation Age of Onset  . Diabetes Mother   . Hypertension Mother   . Stroke Mother   . Heart attack Mother     Social History Social History   Tobacco Use  . Smoking status: Never Smoker  . Smokeless tobacco: Never Used  Substance Use Topics  . Alcohol use: No  . Drug use: No     Allergies   Patient has no known allergies.   Review of Systems As per HPI   Physical Exam Triage Vital Signs ED Triage Vitals  Enc Vitals Group     BP      Pulse      Resp      Temp      Temp src      SpO2      Weight      Height      Head  Circumference      Peak Flow      Pain Score      Pain Loc      Pain Edu?      Excl. in GC?    No data found.  Updated Vital Signs BP 135/86 (BP Location: Left Arm)   Pulse 88   Temp 98.2 F (36.8 C) (Oral)   Resp 18   LMP 01/01/2012   SpO2 97%   Visual Acuity Right Eye Distance:   Left Eye Distance:   Bilateral Distance:    Right Eye Near:   Left Eye Near:    Bilateral Near:     Physical Exam Constitutional:      General: She is not in acute distress.    Appearance: She is well-developed and normal weight. She is not ill-appearing.  HENT:     Head: Normocephalic and atraumatic.  Eyes:     General: No scleral icterus.    Pupils: Pupils are equal, round, and reactive to light.  Neck:     Comments: Left neck arch tenderness Cardiovascular:     Rate and Rhythm: Normal rate.     Heart sounds: Normal heart sounds.  Pulmonary:     Effort: Pulmonary  effort is normal. No tachypnea, accessory muscle usage or respiratory distress.     Breath sounds: No wheezing or rales.  Chest:     Chest wall: Tenderness present. No mass or deformity.     Comments: Supralateral aspect of left pectoralis Abdominal:     Palpations: Abdomen is soft.     Tenderness: There is no abdominal tenderness.  Musculoskeletal:     Cervical back: Neck supple.     Comments: Left upper trapezius hypertrophic as compared to right, tender.  No crepitus.  Full active ROM.  NVI  Skin:    Coloration: Skin is not jaundiced or pale.  Neurological:     Mental Status: She is alert and oriented to person, place, and time.      UC Treatments / Results  Labs (all labs ordered are listed, but only abnormal results are displayed) Labs Reviewed  POCT URINALYSIS DIP (MANUAL ENTRY) - Abnormal; Notable for the following components:      Result Value   Spec Grav, UA >=1.030 (*)    Blood, UA trace-intact (*)    All other components within normal limits    EKG   Radiology No results found.  Procedures Procedures (including critical care time)  Medications Ordered in UC Medications - No data to display  Initial Impression / Assessment and Plan / UC Course  I have reviewed the triage vital signs and the nursing notes.  Pertinent labs & imaging results that were available during my care of the patient were reviewed by me and considered in my medical decision making (see chart for details).     Patient afebrile, nontoxic, and hemodynamically stable in office.  EKG done in office, reviewed by me and compared to previous from 08/24/2018: NSR with ventricular rate 75 bpm.  No QTC prolongation, ST elevation or depression.  Waveforms stable in all leads: Nonacute EKG.  Reviewed findings with patient verbalized understanding.  Urine dipstick significant for trace intact blood, elevated specific gravity.  H&P consistent with musculoskeletal strain second overuse.  Provided work  note, reviewed supportive care as outlined below.  Return precautions discussed, patient verbalized understanding and is agreeable to plan. Final Clinical Impressions(s) / UC Diagnoses   Final diagnoses:  Muscular chest pain  Trapezius  strain, left, initial encounter  Gastroesophageal reflux disease, unspecified whether esophagitis present     Discharge Instructions     Recommend RICE: rest, ice, compression, elevation as needed for pain.    Heat therapy (hot compress, warm wash rag, hot showers, etc.) can help relax muscles and soothe muscle aches. Cold therapy (ice packs) can be used to help swelling both after injury and after prolonged use of areas of chronic pain/aches.  For pain: Naproxen twice daily with food. May take Tylenol additionally if needed.  May take muscle relaxer as needed for severe pain / spasm.  (This medication may cause you to become tired so it is important you do not drink alcohol or operate heavy machinery while on this medication.  Recommend your first dose to be taken before bedtime to monitor for side effects safely)    ED Prescriptions    Medication Sig Dispense Auth. Provider   omeprazole (PRILOSEC) 20 MG capsule Take 1 capsule (20 mg total) by mouth daily. 30 capsule Hall-Potvin, [place], PA-C   naproxen (NAPROSYN) 500 MG tablet Take 1 tablet (500 mg total) by mouth 2 (two) times daily. 30 tablet Hall-Potvin, [place], PA-C   cyclobenzaprine (FLEXERIL) 5 MG tablet Take 1 tablet (5 mg total) by mouth 2 (two) times daily as needed for up to 5 days for muscle spasms. 10 tablet Hall-Potvin, [place], PA-C     I have reviewed the PDMP during this encounter.   Hall-Potvin, [place], [place] 10/08/19 1629

## 2020-10-13 ENCOUNTER — Ambulatory Visit (HOSPITAL_COMMUNITY)
Admission: EM | Admit: 2020-10-13 | Discharge: 2020-10-13 | Disposition: A | Discharge status: Home / Self Care | Payer: Commercial Managed Care - PPO | Attending: Urgent Care | Admitting: Urgent Care

## 2020-10-13 ENCOUNTER — Encounter (HOSPITAL_COMMUNITY): Payer: Self-pay

## 2020-10-13 DIAGNOSIS — M542 Cervicalgia: Secondary | ICD-10-CM

## 2020-10-13 DIAGNOSIS — H9202 Otalgia, left ear: Secondary | ICD-10-CM

## 2020-10-13 DIAGNOSIS — T162XXA Foreign body in left ear, initial encounter: Secondary | ICD-10-CM

## 2020-10-13 MED ORDER — NAPROXEN 375 MG PO TABS: 375.0000 mg | ORAL_TABLET | Freq: Two times a day (BID) | ORAL | 0 refills | Status: DC

## 2020-10-13 MED ORDER — LIDOCAINE-EPINEPHRINE-TETRACAINE (LET) TOPICAL GEL
TOPICAL | Status: AC
  Filled 2020-10-13: qty 3

## 2020-10-13 NOTE — ED Provider Notes (Signed)
[facility]   MRN: [id number] DOB: May 04, 1970  Subjective:   Lisa Woodward is a 50 y.o. female presenting for acute onset today of right-sided neck pain.  Patient states that symptoms started after she got a Q-tip stuck in her right ear and took it out.  Feels like she really tensed up from the pain of having it in there.  Denies foreign body sensation of the right ear.  She does have an ear stud that stuck in the left auricle of her ear which she would like removed.  She was able to take the back of the earring stud out.  Denies fever, ear drainage, trauma.  No current facility-administered medications for this encounter.  Current Outpatient Medications:    albuterol (PROVENTIL HFA;VENTOLIN HFA) 108 (90 BASE) MCG/ACT inhaler, Inhale 2 puffs into the lungs every 4 (four) hours as needed for wheezing or shortness of breath., Disp: 1 Inhaler, Rfl: 0   Aspirin-Salicylamide-Caffeine (BC HEADACHE POWDER PO), Take 1 packet by mouth daily as needed (pain). , Disp: , Rfl:    naproxen (NAPROSYN) 500 MG tablet, Take 1 tablet (500 mg total) by mouth 2 (two) times daily., Disp: 30 tablet, Rfl: 0   nystatin cream (MYCOSTATIN), Apply to affected area 2 times daily, Disp: 30 g, Rfl: 0   Omega-3 Fatty Acids (FISH OIL PO), Take 1 capsule by mouth daily., Disp: , Rfl:    omeprazole (PRILOSEC) 20 MG capsule, Take 1 capsule (20 mg total) by mouth daily., Disp: 30 capsule, Rfl: 0   triamcinolone (NASACORT) 55 MCG/ACT AERO nasal inhaler, Place 2 sprays into the nose daily., Disp: 1 Inhaler, Rfl: 0   No Known Allergies  History reviewed. No pertinent past medical history.   Past Surgical History:  Procedure Laterality Date   CHOLECYSTECTOMY     HAND SURGERY      Family History  Problem Relation Age of Onset   Diabetes Mother    Hypertension Mother    Stroke Mother    Heart attack Mother     Social History   Tobacco Use   Smoking status: Never   Smokeless tobacco: Never   Substance Use Topics   Alcohol use: No   Drug use: No    ROS   Objective:   Vitals: BP 132/90 (BP Location: Right Arm)   Pulse 72   Temp 98.6 F (37 C) (Oral)   Resp 17   LMP 01/01/2012   SpO2 100%   Physical Exam Constitutional:      General: She is not in acute distress.    Appearance: Normal appearance. She is well-developed. She is not ill-appearing, toxic-appearing or diaphoretic.  HENT:     Head: Normocephalic and atraumatic.     Right Ear: Tympanic membrane, ear canal and external ear normal.     Left Ear: Tympanic membrane and ear canal normal.     Ears:      Comments: I applied approximately 2 cc of LET anteriorly and posteriorly about the area in question.  After 10 minutes, I used 2 hemostats to apply pressure anteriorly and was able to retrieve the stud without incident.  Ear cleansed, dressing applied.    Nose: Nose normal.     Mouth/Throat:     Mouth: Mucous membranes are moist.     Pharynx: Oropharynx is clear.  Eyes:     General: No scleral icterus.    Extraocular Movements: Extraocular movements intact.     Pupils: Pupils are equal,  round, and reactive to light.  Cardiovascular:     Rate and Rhythm: Normal rate.  Pulmonary:     Effort: Pulmonary effort is normal.  Skin:    General: Skin is warm and dry.  Neurological:     General: No focal deficit present.     Mental Status: She is alert and oriented to person, place, and time.  Psychiatric:        Mood and Affect: Mood normal.        Behavior: Behavior normal.     Assessment and Plan :   PDMP not reviewed this encounter.  1. Left ear pain   2. Foreign body in auricle of left ear, initial encounter   3. Neck pain on right side     Counseled on general wound care.  Recommended naproxen for pain and inflammation.  Discussed signs of infection warranting a recheck. Counseled patient on potential for adverse effects with medications prescribed/recommended today, ER and return-to-clinic  precautions discussed, patient verbalized understanding.    Wallis Bamberg, [place] 10/13/20 1635

## 2020-10-13 NOTE — ED Triage Notes (Signed)
Pt presents with a earing stuck inside the left earlobe. Pt states she woke up with the earing stuck. She states she stuck a Q-tip too far in her right hear that has caused neck pain.

## 2021-03-11 ENCOUNTER — Other Ambulatory Visit: Payer: Self-pay

## 2021-03-11 ENCOUNTER — Ambulatory Visit (HOSPITAL_COMMUNITY)
Admission: EM | Admit: 2021-03-11 | Discharge: 2021-03-11 | Disposition: A | Discharge status: Home / Self Care | Payer: Self-pay | Attending: Student | Admitting: Student

## 2021-03-11 ENCOUNTER — Encounter (HOSPITAL_COMMUNITY): Payer: Self-pay | Admitting: Emergency Medicine

## 2021-03-11 DIAGNOSIS — R002 Palpitations: Secondary | ICD-10-CM | POA: Insufficient documentation

## 2021-03-11 DIAGNOSIS — H6591 Unspecified nonsuppurative otitis media, right ear: Secondary | ICD-10-CM | POA: Insufficient documentation

## 2021-03-11 LAB — POCT RAPID STREP A, ED / UC: Streptococcus, Group A Screen (Direct): NEGATIVE

## 2021-03-11 MED ORDER — FLUTICASONE PROPIONATE 50 MCG/ACT NA SUSP: 2.0000 | Freq: Every day | NASAL | 2 refills | Status: DC

## 2021-03-11 MED ORDER — PREDNISONE 20 MG PO TABS: 20.0000 mg | ORAL_TABLET | Freq: Every day | ORAL | 0 refills | Status: AC

## 2021-03-11 NOTE — ED Notes (Signed)
Swab in lab 

## 2021-03-11 NOTE — ED Provider Notes (Signed)
[facility]    CSN: [id number] Arrival date & time: 03/11/21  1015      History   Chief Complaint Chief Complaint  Patient presents with   Cough    HPI Lisa Woodward is a 50 y.o. female presenting with right ear pain and incidental chest pain.  Medical history noncontributory, denies history of cardiopulmonary disease.  Describes 1 week of cough, right ear pain rating to the neck, sore throat, palpitations and chest pain.  Denies hearing changes, dizziness, tinnitus.  States that she has been having 3 episodes of palpitations nightly, these last about 15 minutes and wake her from her sleep.  They terminate on their own.  Also with associated chest pain and shortness of breath, she is not able to describe what the chest pain feels like.  Denies current cardiac symptoms- CP, SOB, dizziness, weakness. Denies known sick exposures.   HPI  History reviewed. No pertinent past medical history.  There are no problems to display for this patient.   Past Surgical History:  Procedure Laterality Date   CHOLECYSTECTOMY     HAND SURGERY      OB History   No obstetric history on file.      Home Medications    Prior to Admission medications   Medication Sig Start Date End Date Taking? Authorizing Provider  fluticasone (FLONASE) 50 MCG/ACT nasal spray Place 2 sprays into both nostrils daily. 03/11/21  Yes Hazel Sams, PA-C  predniSONE (DELTASONE) 20 MG tablet Take 1 tablet (20 mg total) by mouth daily for 5 days. 03/11/21 03/16/21 Yes Hazel Sams, PA-C  albuterol (PROVENTIL HFA;VENTOLIN HFA) 108 (90 BASE) MCG/ACT inhaler Inhale 2 puffs into the lungs every 4 (four) hours as needed for wheezing or shortness of breath. Patient not taking: Reported on 03/11/2021 07/04/14   Janne Napoleon, NP  Aspirin-Salicylamide-Caffeine (BC HEADACHE POWDER PO) Take 1 packet by mouth daily as needed (pain).     [provider]  naproxen (NAPROSYN) 375 MG tablet Take 1 tablet  (375 mg total) by mouth 2 (two) times daily. Patient not taking: Reported on 03/11/2021 10/13/20   Jaynee Eagles, PA-C  nystatin cream (MYCOSTATIN) Apply to affected area 2 times daily 03/27/18   Raylene Everts, MD  Omega-3 Fatty Acids (FISH OIL PO) Take 1 capsule by mouth daily. Patient not taking: Reported on 03/11/2021    [provider]  omeprazole (PRILOSEC) 20 MG capsule Take 1 capsule (20 mg total) by mouth daily. Patient not taking: Reported on 03/11/2021 10/08/19   Hall-Potvin, [place], PA-C  triamcinolone (NASACORT) 55 MCG/ACT AERO nasal inhaler Place 2 sprays into the nose daily. Patient not taking: Reported on 03/11/2021 09/08/17   Wynona Luna, MD    Family History Family History  Problem Relation Age of Onset   Diabetes Mother    Hypertension Mother    Stroke Mother    Heart attack Mother     Social History Social History   Tobacco Use   Smoking status: Never   Smokeless tobacco: Never  Vaping Use   Vaping Use: Never used  Substance Use Topics   Alcohol use: Yes   Drug use: Yes    Types: Marijuana     Allergies   Patient has no known allergies.   Review of Systems Review of Systems  Constitutional:  Negative for appetite change, chills and fever.  HENT:  Positive for congestion and ear pain. Negative for rhinorrhea, sinus pressure, sinus pain and sore throat.  Eyes:  Negative for redness and visual disturbance.  Respiratory:  Positive for cough. Negative for chest tightness, shortness of breath and wheezing.   Cardiovascular:  Positive for chest pain and palpitations.  Gastrointestinal:  Negative for abdominal pain, constipation, diarrhea, nausea and vomiting.  Genitourinary:  Negative for dysuria, frequency and urgency.  Musculoskeletal:  Negative for myalgias.  Neurological:  Negative for dizziness, weakness and headaches.  Psychiatric/Behavioral:  Negative for confusion.   All other systems reviewed and are negative.   Physical  Exam Triage Vital Signs ED Triage Vitals  Enc Vitals Group     BP      Pulse      Resp      Temp      Temp src      SpO2      Weight      Height      Head Circumference      Peak Flow      Pain Score      Pain Loc      Pain Edu?      Excl. in [place]?    No data found.  Updated Vital Signs BP (!) 138/94 (BP Location: Left Arm)   Pulse 76   Temp 98.2 F (36.8 C) (Oral)   Resp 18   LMP 01/01/2012   SpO2 98%   Visual Acuity Right Eye Distance:   Left Eye Distance:   Bilateral Distance:    Right Eye Near:   Left Eye Near:    Bilateral Near:     Physical Exam Vitals reviewed.  Constitutional:      General: She is not in acute distress.    Appearance: Normal appearance. She is not ill-appearing or diaphoretic.  HENT:     Head: Normocephalic and atraumatic.     Right Ear: Ear canal and external ear normal. No tenderness. A middle ear effusion is present. There is no impacted cerumen. Tympanic membrane is not perforated, erythematous, retracted or bulging.     Left Ear: Tympanic membrane, ear canal and external ear normal. No tenderness.  No middle ear effusion. There is no impacted cerumen. Tympanic membrane is not perforated, erythematous, retracted or bulging.     Nose: Nose normal. No congestion.     Mouth/Throat:     Mouth: Mucous membranes are moist.     Pharynx: Uvula midline. No oropharyngeal exudate or posterior oropharyngeal erythema.  Eyes:     Extraocular Movements: Extraocular movements intact.     Pupils: Pupils are equal, round, and reactive to light.  Cardiovascular:     Rate and Rhythm: Normal rate and regular rhythm.     Pulses:          Radial pulses are 2+ on the right side and 2+ on the left side.     Heart sounds: Normal heart sounds.  Pulmonary:     Effort: Pulmonary effort is normal.     Breath sounds: Normal breath sounds. No decreased breath sounds, wheezing, rhonchi or rales.  Abdominal:     Palpations: Abdomen is soft.     Tenderness:  There is no abdominal tenderness. There is no guarding or rebound.  Musculoskeletal:     Right lower leg: No edema.     Left lower leg: No edema.  Lymphadenopathy:     Cervical: No cervical adenopathy.     Right cervical: No superficial cervical adenopathy.    Left cervical: No superficial cervical adenopathy.  Skin:    General: Skin is  warm.     Capillary Refill: Capillary refill takes less than 2 seconds.  Neurological:     General: No focal deficit present.     Mental Status: She is alert and oriented to person, place, and time.  Psychiatric:        Mood and Affect: Mood normal.        Behavior: Behavior normal.        Thought Content: Thought content normal.        Judgment: Judgment normal.     UC Treatments / Results  Labs (all labs ordered are listed, but only abnormal results are displayed) Labs Reviewed  CULTURE, GROUP A STREP [facility])  POCT RAPID STREP A, ED / UC    EKG   Radiology No results found.  Procedures ED EKG  Date/Time: 03/11/2021 11:32 AM Performed by: Rhys Martini, PA-C Authorized by: Rhys Martini, PA-C   (including critical care time)  Medications Ordered in UC Medications - No data to display  Initial Impression / Assessment and Plan / UC Course  I have reviewed the triage vital signs and the nursing notes.  Pertinent labs & imaging results that were available during my care of the patient were reviewed by me and considered in my medical decision making (see chart for details).     This patient is a very pleasant 50 y.o. year old female presenting with mid ear effusion and palpitations (none currently). Today this pt is afebrile nontachycardic nontachypneic, oxygenating well on room air, no wheezes rhonchi or rales.   EKG NSR, unchanged compared with 2021 EKG.  For mid ear effusion, flonase and low-dose prednisone as below.   Strict ED return precautions discussed. Patient verbalizes understanding and agreement.    Final  Clinical Impressions(s) / UC Diagnoses   Final diagnoses:  Fluid level behind tympanic membrane of right ear  Palpitations     Discharge Instructions      -Your EKG looks normal right now. This is only a brief picture in time of your heart, and does not rule out heart disease.  -If you develop chest pain, shortness of breath, vision changes, the worst headache of your life- head straight to the ED or call 911.  For your ear: -Prednisone 1 pill daily taken with breakfast or lunch. -Flonase nasal steroid: place 2 sprays into both nostrils in the morning and at bedtime for at least 7 days. Continue for longer if this is helping.       ED Prescriptions     Medication Sig Dispense Auth. Provider   predniSONE (DELTASONE) 20 MG tablet Take 1 tablet (20 mg total) by mouth daily for 5 days. 5 tablet Rhys Martini, PA-C   fluticasone [facility]) 50 MCG/ACT nasal spray Place 2 sprays into both nostrils daily. 15 mL Rhys Martini, PA-C      PDMP not reviewed this encounter.   Rhys Martini, PA-C 03/11/21 1137

## 2021-03-11 NOTE — ED Triage Notes (Signed)
Cough, right ear pain, sore throat, neck pain started one week ago.

## 2021-03-11 NOTE — Discharge Instructions (Addendum)
-  Your EKG looks normal right now. This is only a brief picture in time of your heart, and does not rule out heart disease.  -If you develop chest pain, shortness of breath, vision changes, the worst headache of your life- head straight to the ED or call 911.  For your ear: -Prednisone 1 pill daily taken with breakfast or lunch. -Flonase nasal steroid: place 2 sprays into both nostrils in the morning and at bedtime for at least 7 days. Continue for longer if this is helping.

## 2021-03-13 LAB — CULTURE, GROUP A STREP (THRC)

## 2022-09-13 ENCOUNTER — Encounter (HOSPITAL_COMMUNITY): Payer: Self-pay

## 2022-09-13 ENCOUNTER — Ambulatory Visit (INDEPENDENT_AMBULATORY_CARE_PROVIDER_SITE_OTHER): Payer: Medicaid Other

## 2022-09-13 ENCOUNTER — Ambulatory Visit (HOSPITAL_COMMUNITY)
Admission: EM | Admit: 2022-09-13 | Discharge: 2022-09-13 | Disposition: A | Discharge status: Home / Self Care | Payer: Medicaid Other | Attending: Family Medicine | Admitting: Family Medicine

## 2022-09-13 DIAGNOSIS — M79605 Pain in left leg: Secondary | ICD-10-CM

## 2022-09-13 DIAGNOSIS — M25552 Pain in left hip: Secondary | ICD-10-CM

## 2022-09-13 MED ORDER — KETOROLAC TROMETHAMINE 30 MG/ML IJ SOLN
INTRAMUSCULAR | Status: AC
  Filled 2022-09-13: qty 1

## 2022-09-13 MED ORDER — PREDNISONE 20 MG PO TABS: 40.0000 mg | ORAL_TABLET | Freq: Every day | ORAL | 0 refills | Status: AC

## 2022-09-13 MED ORDER — KETOROLAC TROMETHAMINE 30 MG/ML IJ SOLN
30.0000 mg | Freq: Once | INTRAMUSCULAR | Status: AC
  Administered 2022-09-13: 30 mg via INTRAMUSCULAR

## 2022-09-13 NOTE — ED Provider Notes (Addendum)
[facility]    CSN: [id number] Arrival date & time: 09/13/22  1017      History   Chief Complaint Chief Complaint  Patient presents with   Leg Pain   Hip Pain    HPI Lisa Woodward is a 52 y.o. female.    Leg Pain Hip Pain   Here for pain that begins in her left buttock and radiates down into her left lower leg.  She also has some pain in her lateral left hip.  This began about 2 days ago when she was sitting on a hard surface on a cement porch.  No trauma or fall.  She does have a history of a left leg injury along long time ago . No fever or chills.  She has an allergy medication.  Last menstrual cycle was about 10 or 11 years ago  History reviewed. No pertinent past medical history.  There are no problems to display for this patient.   Past Surgical History:  Procedure Laterality Date   CHOLECYSTECTOMY     HAND SURGERY      OB History   No obstetric history on file.      Home Medications    Prior to Admission medications   Medication Sig Start Date End Date Taking? Authorizing Provider  predniSONE (DELTASONE) 20 MG tablet Take 2 tablets (40 mg total) by mouth daily with breakfast for 5 days. 09/13/22 09/18/22 Yes Zenia Resides, MD  nystatin cream (MYCOSTATIN) Apply to affected area 2 times daily 03/27/18   Eustace Moore, MD    Family History Family History  Problem Relation Age of Onset   Diabetes Mother    Hypertension Mother    Stroke Mother    Heart attack Mother     Social History Social History   Tobacco Use   Smoking status: Never   Smokeless tobacco: Never  Vaping Use   Vaping Use: Never used  Substance Use Topics   Alcohol use: Not Currently   Drug use: Yes    Types: Marijuana     Allergies   Patient has no known allergies.   Review of Systems Review of Systems   Physical Exam Triage Vital Signs ED Triage Vitals  Enc Vitals Group     BP 09/13/22 1034 (!) 149/94     Pulse Rate 09/13/22 1034  70     Resp 09/13/22 1034 18     Temp 09/13/22 1034 98.4 F (36.9 C)     Temp Source 09/13/22 1034 Oral     SpO2 09/13/22 1034 98 %     Weight --      Height --      Head Circumference --      Peak Flow --      Pain Score 09/13/22 1032 7     Pain Loc --      Pain Edu? --      Excl. in [place]? --    No data found.  Updated Vital Signs BP (!) 149/94 (BP Location: Right Arm)   Pulse 70   Temp 98.4 F (36.9 C) (Oral)   Resp 18   LMP 01/01/2012   SpO2 98%   Visual Acuity Right Eye Distance:   Left Eye Distance:   Bilateral Distance:    Right Eye Near:   Left Eye Near:    Bilateral Near:     Physical Exam Vitals reviewed.  Constitutional:      General: She is not in acute  distress.    Appearance: She is not ill-appearing, toxic-appearing or diaphoretic.     Comments: She is sitting on the chair leaning onto her right buttock and leg.  Musculoskeletal:     Comments: There is tenderness over the left sciatic notch and the left lateral hip.  Straight leg raise causes more pain to radiate down the left leg  Skin:    Coloration: Skin is not jaundiced or pale.  Neurological:     Mental Status: She is alert.  Psychiatric:        Behavior: Behavior normal.      UC Treatments / Results  Labs (all labs ordered are listed, but only abnormal results are displayed) Labs Reviewed - No data to display  EKG   Radiology DG Hip Unilat With Pelvis 2-3 Views Left  Result Date: 09/13/2022 CLINICAL DATA:  Left hip and left leg pain starting 2 days ago. EXAM: DG HIP (WITH OR WITHOUT PELVIS) 2-3V LEFT COMPARISON:  None Available. FINDINGS: Moderate bilateral superomedial femoroacetabular joint space narrowing. Mild to moderate right and mild left femoral head-neck junction circumferential degenerative osteophytes. The bilateral sacroiliac joint spaces are maintained. Minimal superior pubic symphysis degenerative spurring. No acute fracture or dislocation. Surgical clips overlie the  inferior right hemipelvis. IMPRESSION: Moderate bilateral femoroacetabular osteoarthritis. Electronically Signed   By: Neita Garnet M.D.   On: 09/13/2022 12:16    Procedures Procedures (including critical care time)  Medications Ordered in UC Medications  ketorolac (TORADOL) 30 MG/ML injection 30 mg (has no administration in time range)    Initial Impression / Assessment and Plan / UC Course  I have reviewed the triage vital signs and the nursing notes.  Pertinent labs & imaging results that were available during my care of the patient were reviewed by me and considered in my medical decision making (see chart for details).        X-ray shows some arthritis bilaterally in the hip joints. Prednisone is sent in for 5-day burst.  Toradol is given for pain here in the clinic. Final Clinical Impressions(s) / UC Diagnoses   Final diagnoses:  Left leg pain  Left hip pain     Discharge Instructions      X-ray shows some arthritis in your hips.  You have been given a shot of Toradol 30 mg today.  Take prednisone 20 mg--2 daily for 5 days      ED Prescriptions     Medication Sig Dispense Auth. Provider   predniSONE (DELTASONE) 20 MG tablet Take 2 tablets (40 mg total) by mouth daily with breakfast for 5 days. 10 tablet Marlinda Mike Janace Aris, MD      PDMP not reviewed this encounter.   Zenia Resides, MD 09/13/22 1230    Zenia Resides, MD 09/13/22 [phone]

## 2022-09-13 NOTE — ED Triage Notes (Signed)
Pain in the left hip and leg onset 2 days ago. Patient states she was sitting on a cement porch when the pain started. No falls or known injuries. 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

## 2022-09-13 NOTE — Discharge Instructions (Signed)
X-ray shows some arthritis in your hips.  You have been given a shot of Toradol 30 mg today.  Take prednisone 20 mg--2 daily for 5 days

## 2022-09-16 ENCOUNTER — Telehealth: Payer: Medicaid Other | Admitting: Nurse Practitioner

## 2022-09-16 DIAGNOSIS — M79605 Pain in left leg: Secondary | ICD-10-CM

## 2022-09-16 DIAGNOSIS — M25469 Effusion, unspecified knee: Secondary | ICD-10-CM

## 2022-09-16 DIAGNOSIS — M25552 Pain in left hip: Secondary | ICD-10-CM

## 2022-09-16 NOTE — Patient Instructions (Signed)
[address], [place], [place] [address]  [phone]

## 2022-09-16 NOTE — Progress Notes (Signed)
Virtual Visit Consent   Chase Caller, you are scheduled for a virtual visit with a [facility] provider today. Just as with appointments in the office, your consent must be obtained to participate. Your consent will be active for this visit and any virtual visit you may have with one of our providers in the next 365 days. If you have a MyChart account, a copy of this consent can be sent to you electronically.  As this is a virtual visit, video technology does not allow for your provider to perform a traditional examination. This may limit your provider's ability to fully assess your condition. If your provider identifies any concerns that need to be evaluated in person or the need to arrange testing (such as labs, EKG, etc.), we will make arrangements to do so. Although advances in technology are sophisticated, we cannot ensure that it will always work on either your end or our end. If the connection with a video visit is poor, the visit may have to be switched to a telephone visit. With either a video or telephone visit, we are not always able to ensure that we have a secure connection.  By engaging in this virtual visit, you consent to the provision of healthcare and authorize for your insurance to be billed (if applicable) for the services provided during this visit. Depending on your insurance coverage, you may receive a charge related to this service.  I need to obtain your verbal consent now. Are you willing to proceed with your visit today? Lisa Woodward has provided verbal consent on 09/16/2022 for a virtual visit (video or telephone). Viviano Simas, FNP  Date: 09/16/2022 5:39 PM  Virtual Visit via Video Note   I, Viviano Simas, connected with  Lisa Woodward  ([id number], [birth year]) on 09/16/22 at  5:45 PM EDT by a video-enabled telemedicine application and verified that I am speaking with the correct person using two identifiers.  Location: Patient: Virtual Visit Location  Patient: Home Provider: Virtual Visit Location Provider: Home Office   I discussed the limitations of evaluation and management by telemedicine and the availability of in person appointments. The patient expressed understanding and agreed to proceed.    History of Present Illness: Lisa Woodward is a 52 y.o. who identifies as a female who was assigned female at birth, and is being seen today for left knee swelling.  She has pain from her left buttock to left hip down the back of her leg to her foot  She cannot relieve the pain with sitting, standing or laying down Swelling is present at the knee and behind the knee   Onset of this was 09/11/22 after she was sitting on concrete for an extended period of time  No trauma or fall  Patient was in the UC on 09/13/22 with complaints of leg and hip pain  She was treated with prednisone at that time but has not picked up that prescription  X-ray Imaging of the left hip was performed at that time evidencing: IMPRESSION:  Moderate bilateral femoroacetabular osteoarthritis.  Denies a history of blood clots  She did have mild URI symptoms 1-2 weeks prior to this  She does not take daily medications  She does not see a primary care MD   BP in ED was 149/94 No comprehensive blood work in the past 4 years    Problems: There are no problems to display for this patient.   Allergies: No Known Allergies Medications:  Current Outpatient Medications:  nystatin cream (MYCOSTATIN), Apply to affected area 2 times daily, Disp: 30 g, Rfl: 0   predniSONE (DELTASONE) 20 MG tablet, Take 2 tablets (40 mg total) by mouth daily with breakfast for 5 days., Disp: 10 tablet, Rfl: 0  Observations/Objective: Patient is well-developed, well-nourished in no acute distress.  Resting comfortably  at home.  Head is normocephalic, atraumatic.  No labored breathing.  Speech is clear and coherent with logical content.  Patient is alert and oriented at baseline.     Assessment and Plan: 1. Pain of left hip  2. Pain of left lower extremity  3. Knee swelling Due to worsening symptoms patient is instructed to return to ED for additional imaging. Likely sciatica/ unrelieved now with concern for possible DVT due to decreased mobility and new onset swelling.   Patient will report to ED as directed  Needs referral to PCP as well  Agreeable to plan and has family available for transport      Follow Up Instructions: I discussed the assessment and treatment plan with the patient. The patient was provided an opportunity to ask questions and all were answered. The patient agreed with the plan and demonstrated an understanding of the instructions.  A copy of instructions were sent to the patient via MyChart unless otherwise noted below.    The patient was advised to call back or seek an in-person evaluation if the symptoms worsen or if the condition fails to improve as anticipated.  Time:  I spent 15 minutes with the patient via telehealth technology discussing the above problems/concerns.    Viviano Simas, FNP

## 2022-09-23 ENCOUNTER — Emergency Department (HOSPITAL_COMMUNITY)
Admission: EM | Admit: 2022-09-23 | Discharge: 2022-09-23 | Disposition: A | Discharge status: Home / Self Care | Payer: 59 | Attending: Emergency Medicine | Admitting: Emergency Medicine

## 2022-09-23 ENCOUNTER — Other Ambulatory Visit: Payer: Self-pay

## 2022-09-23 DIAGNOSIS — M5432 Sciatica, left side: Secondary | ICD-10-CM | POA: Insufficient documentation

## 2022-09-23 DIAGNOSIS — M5442 Lumbago with sciatica, left side: Secondary | ICD-10-CM | POA: Diagnosis not present

## 2022-09-23 DIAGNOSIS — M545 Low back pain, unspecified: Secondary | ICD-10-CM | POA: Diagnosis not present

## 2022-09-23 MED ORDER — DEXAMETHASONE SODIUM PHOSPHATE 10 MG/ML IJ SOLN
10.0000 mg | Freq: Once | INTRAMUSCULAR | Status: AC
  Administered 2022-09-23: 10 mg via INTRAMUSCULAR
  Filled 2022-09-23: qty 1

## 2022-09-23 MED ORDER — DIAZEPAM 2 MG PO TABS
2.0000 mg | ORAL_TABLET | Freq: Once | ORAL | Status: AC
  Administered 2022-09-23: 2 mg via ORAL
  Filled 2022-09-23: qty 1

## 2022-09-23 MED ORDER — METHOCARBAMOL 500 MG PO TABS: 500.0000 mg | ORAL_TABLET | Freq: Two times a day (BID) | ORAL | 0 refills | Status: DC

## 2022-09-23 MED ORDER — OXYCODONE-ACETAMINOPHEN 5-325 MG PO TABS
1.0000 | ORAL_TABLET | ORAL | Status: DC | PRN
  Administered 2022-09-23: 1 via ORAL
  Filled 2022-09-23: qty 1

## 2022-09-23 MED ORDER — PREDNISONE 10 MG (21) PO TBPK: ORAL_TABLET | Freq: Every day | ORAL | 0 refills | Status: DC

## 2022-09-23 MED ORDER — HYDROCODONE-ACETAMINOPHEN 5-325 MG PO TABS: 1.0000 | ORAL_TABLET | Freq: Two times a day (BID) | ORAL | 0 refills | Status: DC | PRN

## 2022-09-23 MED ORDER — NAPROXEN 250 MG PO TABS
500.0000 mg | ORAL_TABLET | Freq: Once | ORAL | Status: AC
  Administered 2022-09-23: 500 mg via ORAL
  Filled 2022-09-23: qty 2

## 2022-09-23 MED ORDER — IBUPROFEN 600 MG PO TABS: 600.0000 mg | ORAL_TABLET | Freq: Four times a day (QID) | ORAL | 0 refills | Status: DC

## 2022-09-23 NOTE — ED Triage Notes (Signed)
Pt reports pain down entire L side from shoulder to ankle, especially with movement. Denies injury. States went to [facility] for same last week but pain is no better. Taking advil without relief.

## 2022-09-23 NOTE — ED Provider Notes (Signed)
[place] EMERGENCY DEPARTMENT AT [facility] Provider Note   CSN: [id number] Arrival date & time: 09/23/22  1319     History  No chief complaint on file.   Lisa Woodward is a 52 y.o. female.  HPI     SUBJECTIVE:  Lisa Woodward is a 52 y.o. female who complains of low back pain 1 week(s) ago. The pain is constant and worse with positional with bending or lifting, with radiation down the left leg.  Patient also has tingling sensation to her left foot.    Patient has no specific trigger for this pain.  She had gone to urgent care, had received steroid and initially her symptoms are improved, but now the symptoms are getting worse.  She has no known history of back injury.  Patient had an x-ray done at urgent care which was negative.  She unfortunate does not have a primary care doctor.   Home Medications Prior to Admission medications   Medication Sig Start Date End Date Taking? Authorizing Provider  HYDROcodone-acetaminophen (NORCO/VICODIN) 5-325 MG tablet Take 1 tablet by mouth every 12 (twelve) hours as needed. 09/23/22  Yes Derwood Kaplan, MD  ibuprofen (ADVIL) 600 MG tablet Take 1 tablet (600 mg total) by mouth 4 (four) times daily. 09/23/22  Yes Derwood Kaplan, MD  methocarbamol (ROBAXIN) 500 MG tablet Take 1 tablet (500 mg total) by mouth 2 (two) times daily. 09/23/22  Yes Hinata Diener, MD  predniSONE (STERAPRED UNI-PAK 21 TAB) 10 MG (21) TBPK tablet Take by mouth daily. Take 6 tabs by mouth daily  for 2 days, then 5 tabs for 2 days, then 4 tabs for 2 days, then 3 tabs for 2 days, 2 tabs for 2 days, then 1 tab by mouth daily for 2 days 09/23/22  Yes Derwood Kaplan, MD  nystatin cream (MYCOSTATIN) Apply to affected area 2 times daily 03/27/18   Eustace Moore, MD      Allergies    Patient has no known allergies.    Review of Systems   Review of Systems  Physical Exam Updated Vital Signs BP 137/80 (BP Location: Left Arm)   Pulse 62   Temp 98.1  F (36.7 C) (Oral)   Resp 14   LMP 01/01/2012   SpO2 100%  Physical Exam Vitals and nursing note reviewed.  Constitutional:      Appearance: She is well-developed.  HENT:     Head: Atraumatic.  Cardiovascular:     Rate and Rhythm: Normal rate.  Pulmonary:     Effort: Pulmonary effort is normal.  Musculoskeletal:     Cervical back: Normal range of motion and neck supple.     Comments: Pt has tenderness over the lumbar region No step offs, no erythema. Pt has 1+ patellar reflex bilaterally. Positive passive left leg raise test at 15 degrees. Able to discriminate between sharp and dull. Able to ambulate   Skin:    General: Skin is warm and dry.  Neurological:     Mental Status: She is alert and oriented to person, place, and time.     ED Results / Procedures / Treatments   Labs (all labs ordered are listed, but only abnormal results are displayed) Labs Reviewed - No data to display  EKG None  Radiology No results found.  Procedures Procedures    Medications Ordered in ED Medications  oxyCODONE-acetaminophen (PERCOCET/ROXICET) 5-325 MG per tablet 1 tablet (1 tablet Oral Given 09/23/22 1345)  dexamethasone (DECADRON) injection 10 mg (  has no administration in time range)  naproxen (NAPROSYN) tablet 500 mg (has no administration in time range)  diazepam (VALIUM) tablet 2 mg (has no administration in time range)    ED Course/ Medical Decision Making/ A&P                             Medical Decision Making Risk Prescription drug management.  52 year old female comes in with chief complaint of back pain. She has radiation to the left lower extremity.  She is also having tingling sensation to the left foot.  She had gone to urgent care, reviewed x-rays from the urgent care -it appears that they only get x-ray of the hip.  Patient has no red flags for cancer such as weight loss, night sweats and previous history of cancer.  We will defer x-ray, if patient does not  improve to outpatient setting.  Patient comfortable with the plan.  DDx includes: - DJD of the back - Spondylitises/ spondylosis - Sciatica - Spinal cord compression - Conus medullaris - Epidural hematoma - Epidural abscess - Lytic/pathologic fracture - Myelitis - Musculoskeletal pain   OBJECTIVE: Vital signs as noted above. Patient appears to be in mild to moderate pain, Lumbosacral spine area reveals local tenderness, no mass. Painful and reduced LS ROM noted. Straight leg raise is positive at 15 degrees on left. DTR's, motor strength and sensation normal, including heel and toe gait.  Peripheral pulses are palpable. Lumbar spine X-Ray: not indicated.   ASSESSMENT:  lumbar strain and with radiculopathy  PLAN: For acute pain, rest, intermittent application of cold packs (later, may switch to heat, but do not sleep on heating pad), analgesics and muscle relaxants are recommended. Discussed longer term treatment plan of prn NSAID's and discussed a home back care exercise program with flexion exercise routine. Proper lifting with avoidance of heavy lifting discussed. Consider Physical Therapy and XRay studies if not improving. Call or return to clinic prn if these symptoms worsen or fail to improve as anticipated. Final Clinical Impression(s) / ED Diagnoses Final diagnoses:  Sciatica of left side    Rx / DC Orders ED Discharge Orders          Ordered    predniSONE (STERAPRED UNI-PAK 21 TAB) 10 MG (21) TBPK tablet  Daily        09/23/22 1720    ibuprofen (ADVIL) 600 MG tablet  4 times daily        09/23/22 1721    HYDROcodone-acetaminophen (NORCO/VICODIN) 5-325 MG tablet  Every 12 hours PRN        09/23/22 1721    methocarbamol (ROBAXIN) 500 MG tablet  2 times daily        09/23/22 1722              Derwood Kaplan, MD 09/23/22 1730

## 2022-09-23 NOTE — Discharge Instructions (Addendum)
You are seen in the emergency room for back pain radiating down your left leg.  You have an impingement syndrome or something called sciatica most likely.  Read instructions provided on the condition.  We recommend that you take the medications that are prescribed.  Take hydrocodone only if the pain is severe and you are unable to sleep at night.  Ibuprofen and Tylenol should be the main history of your therapy.  Warm compresses will be helpful.  Stretching exercises will be needed to be done for about 30 minutes each 3 or 4 times a day.  Warm compresses should also be done 3 or 4 times a day for 15 to 20 minutes each.  We recommend that you call the doctors at the number provided if you are not getting better over the next week. Take steroid dosepak only if you are not improving over the next 2 or 3 days.

## 2022-09-24 ENCOUNTER — Encounter: Payer: Self-pay | Admitting: Nurse Practitioner

## 2022-10-02 ENCOUNTER — Other Ambulatory Visit (HOSPITAL_COMMUNITY): Payer: Self-pay

## 2022-10-02 ENCOUNTER — Ambulatory Visit: Payer: 59 | Admitting: Sports Medicine

## 2022-10-02 VITALS — BP 136/86 | Ht 73.0 in | Wt 163.0 lb

## 2022-10-02 DIAGNOSIS — M5442 Lumbago with sciatica, left side: Secondary | ICD-10-CM | POA: Diagnosis not present

## 2022-10-02 MED ORDER — GABAPENTIN 300 MG PO CAPS
300.0000 mg | ORAL_CAPSULE | Freq: Three times a day (TID) | ORAL | 2 refills | Status: AC
  Filled 2022-10-02: qty 90, 30d supply, fill #0

## 2022-10-02 NOTE — Progress Notes (Signed)
[facility] - 52 y.o. female MRN [id number]  Date of birth: [birth year]    CHIEF COMPLAINT:   pain in the lower back and left leg    SUBJECTIVE:   HPI: Chavon Dush is a 52 yo F with no significant pmh who presents to clinic with a two week history of left leg and back pain. She reports a remote history of left leg pain that dates back to 2019 when she had a window hit against her leg while working at [employer]. These symptoms acutely exacerbated 2 weeks ago unprovoked. She describes her pain as a cramping sensation felt in the quadriceps muscle with associated tingling radiating down the back of her leg. Kylin reports that this pain is worsened with activity and awakens her from sleep at night. She has tried taking Vicodin, Ibuprofen, Prednisone, and hydrocodone-acetaminophen with mild relief of symptoms. The patient obtained an X-Ray of the left hip on 09/13/22 which revealed moderate bilateral femoroacetabular arthritis.   ROS:     See HPI  PERTINENT  PMH / PSH FH / / SH:  Past Medical, Surgical, Social, and Family History Reviewed & Updated in the EMR.  Pertinent findings include:  No significant pmh  OBJECTIVE: BP 136/86   Ht 6\' 1"  (1.854 m)   Wt 163 lb (73.9 kg)   LMP 01/01/2012   BMI 21.51 kg/m   Physical Exam:  Vital signs are reviewed.  GEN: Alert and oriented, NAD Pulm: Breathing unlabored PSY: normal mood, congruent affect  MSK: Lumbar Spine - no obvious deformities, swelling, erythema. Forward flexion and extension of the spine deferred by patient due to pain. Non tender to palpation on spine. Tender to palpation on lumbar paraspinal muscles. Palpable lumps medial to left PSIS, and her left leg pain reproducible with palpation of lumps. Positive Straight Leg raise test on the left. Negative straight leg raise test on the right. 1+ patellar and Achilles reflex in bilateral legs. Neurovascularly intact in bilateral lower extremities. 4/5 strength with  plantar and dorsiflexion of bilateral legs Left Hip - Full passive and active flexion and extension. Non tender to palpation on greater trochanteric bursa. Pain in groin with internal and external rotation of the hip. 4/5 strength with hip extension and flexion. 4/5 strength with left knee extension and flexion   ASSESSMENT & PLAN:  1. Lower Back/Left Leg Pain The most likely etiology of this patient's clinical presentation is lumbar radiculopathy vs left hip osteoarthritis. Given that the patient describes a radiating pain down her leg with a positive straight leg raise test on physical exam, we will treat this as lumbar radiculopathy. She will likely benefit from physical therapy as well as gabapentin to help alleviate her symptoms. We would like to see this patient back in 4-6 weeks to assess whether she has experienced relief from her pain with this management course. If she continues to experience similar or worsening pain after 4-6 weeks of conservative management, we will consider treating this pain as hip osteoarthritis rather than lumbar radiculopathy as prior X-Rays of her hips reveal moderate femoroacetabular osteoarthritis. If we do treat this as osteoarthritis of the hip, the patient may benefit from a cortisone injection into the hip joint at her follow up appointment.  - prescribed Gabapentin 300 mg TID for pain control - Physical therapy referral was placed for the patient to focus on strengthening of the lumbar spine and lower extremities. - Follow up in 4-6 weeks. If pain has not improved will consider lumbar spine  imaging vs treating patient for hip osteoarthritis with cortisone injection into the hip joint.  - Work note was given to patient for the remainder of the workweek.   Eustaquio Boyden, MS4  FELLOW ATTESTATION: I personally evaluated the patient with the medical student and agree with the above documentation with the following emphasis: Patient has acute on chronic hip/low back  pain on the left side.  There are some components of hip arthritis and lumbar radiculopathy.  She does have OA seen on her hip x-rays from prior.  However, most of her symptoms today are radicular in nature.  Positive straight leg raise on the left.  Will treat this as radiculopathy with gabapentin and formal PT.  Will see her back in about a month.  If no improvement would consider more imaging of the spine versus treating her for hip OA.  Baldemar Friday Rafoth 10/02/2022

## 2022-10-04 ENCOUNTER — Other Ambulatory Visit (HOSPITAL_COMMUNITY): Payer: Self-pay

## 2022-10-14 DIAGNOSIS — R2689 Other abnormalities of gait and mobility: Secondary | ICD-10-CM | POA: Diagnosis not present

## 2022-10-14 DIAGNOSIS — M25552 Pain in left hip: Secondary | ICD-10-CM | POA: Diagnosis not present

## 2022-10-14 DIAGNOSIS — M79605 Pain in left leg: Secondary | ICD-10-CM | POA: Diagnosis not present

## 2022-10-14 DIAGNOSIS — M5416 Radiculopathy, lumbar region: Secondary | ICD-10-CM | POA: Diagnosis not present

## 2022-10-21 DIAGNOSIS — R2689 Other abnormalities of gait and mobility: Secondary | ICD-10-CM | POA: Diagnosis not present

## 2022-10-21 DIAGNOSIS — M5416 Radiculopathy, lumbar region: Secondary | ICD-10-CM | POA: Diagnosis not present

## 2022-10-21 DIAGNOSIS — M79605 Pain in left leg: Secondary | ICD-10-CM | POA: Diagnosis not present

## 2022-10-21 DIAGNOSIS — M25552 Pain in left hip: Secondary | ICD-10-CM | POA: Diagnosis not present

## 2022-11-06 ENCOUNTER — Ambulatory Visit: Payer: Medicaid Other | Admitting: Family Medicine

## 2023-11-07 ENCOUNTER — Encounter: Payer: Self-pay | Admitting: Advanced Practice Midwife

## 2023-12-08 ENCOUNTER — Other Ambulatory Visit: Payer: Self-pay

## 2023-12-08 ENCOUNTER — Encounter (HOSPITAL_COMMUNITY): Payer: Self-pay | Admitting: Emergency Medicine

## 2023-12-08 ENCOUNTER — Ambulatory Visit (HOSPITAL_COMMUNITY)
Admission: EM | Admit: 2023-12-08 | Discharge: 2023-12-08 | Disposition: A | Discharge status: Home / Self Care | Payer: Self-pay | Attending: Family Medicine | Admitting: Family Medicine

## 2023-12-08 DIAGNOSIS — J069 Acute upper respiratory infection, unspecified: Secondary | ICD-10-CM

## 2023-12-08 LAB — POC SARS CORONAVIRUS 2 AG -  ED: SARS Coronavirus 2 Ag: NEGATIVE

## 2023-12-08 MED ORDER — BENZONATATE 100 MG PO CAPS: 100.0000 mg | ORAL_CAPSULE | Freq: Three times a day (TID) | ORAL | 0 refills | Status: AC | PRN

## 2023-12-08 MED ORDER — ONDANSETRON 4 MG PO TBDP: 4.0000 mg | ORAL_TABLET | Freq: Three times a day (TID) | ORAL | 0 refills | Status: AC | PRN

## 2023-12-08 NOTE — ED Notes (Signed)
 Complains of sore throat and headache, body aches for 24 hours.  Unknown if running a fever.  Patient has taken benadryl  patient reports a productive cough

## 2023-12-08 NOTE — ED Provider Notes (Signed)
 [facility]    CSN: [id number] Arrival date & time: 12/08/23  1507      History   Chief Complaint Chief Complaint  Patient presents with   Sore Throat    HPI Lisa Woodward is a 53 y.o. female.    Sore Throat  Here for sore throat, nasal congestion and rhinorrhea, cough, and myalgia.  Symptoms began yesterday and she felt worse this morning when she woke up.  She has had some nausea but no vomiting.  No diarrhea.  Her breathing maybe feels a little heavy when she is lying down but otherwise she has not noted any dyspnea.  Past medical history is negative for asthma or emphysema.  NKDA  Last menstrual cycle was in 2013.  History reviewed. No pertinent past medical history.  There are no active problems to display for this patient.   Past Surgical History:  Procedure Laterality Date   CHOLECYSTECTOMY     HAND SURGERY      OB History   No obstetric history on file.      Home Medications    Prior to Admission medications   Medication Sig Start Date End Date Taking? Authorizing Provider  benzonatate  (TESSALON ) 100 MG capsule Take 1 capsule (100 mg total) by mouth 3 (three) times daily as needed for cough. 12/08/23  Yes Vonna Sharlet POUR, MD  ondansetron  (ZOFRAN -ODT) 4 MG disintegrating tablet Take 1 tablet (4 mg total) by mouth every 8 (eight) hours as needed for nausea or vomiting. 12/08/23  Yes Vonna Sharlet POUR, MD  gabapentin  (NEURONTIN ) 300 MG capsule Take 1 capsule (300 mg total) by mouth 3 (three) times daily. 10/02/22   Rafoth, Katrinka ORN, MD  nystatin  cream (MYCOSTATIN ) Apply to affected area 2 times daily 03/27/18   Maranda Jamee Jacob, MD    Family History Family History  Problem Relation Age of Onset   Diabetes Mother    Hypertension Mother    Stroke Mother    Heart attack Mother     Social History Social History   Tobacco Use   Smoking status: Never   Smokeless tobacco: Never  Vaping Use   Vaping status: Never Used   Substance Use Topics   Alcohol use: Not Currently   Drug use: Not Currently    Types: Marijuana     Allergies   Patient has no known allergies.   Review of Systems Review of Systems   Physical Exam Triage Vital Signs ED Triage Vitals  Encounter Vitals Group     BP 12/08/23 1609 126/87     Girls Systolic BP Percentile --      Girls Diastolic BP Percentile --      Boys Systolic BP Percentile --      Boys Diastolic BP Percentile --      Pulse Rate 12/08/23 1609 97     Resp 12/08/23 1609 18     Temp 12/08/23 1609 98.3 F (36.8 C)     Temp Source 12/08/23 1609 Oral     SpO2 12/08/23 1609 98 %     Weight --      Height --      Head Circumference --      Peak Flow --      Pain Score 12/08/23 1607 7     Pain Loc --      Pain Education --      Exclude from Growth Chart --    No data found.  Updated Vital Signs BP 126/87 (  BP Location: Right Arm)   Pulse 97   Temp 98.3 F (36.8 C) (Oral)   Resp 18   LMP 01/01/2012   SpO2 98%   Visual Acuity Right Eye Distance:   Left Eye Distance:   Bilateral Distance:    Right Eye Near:   Left Eye Near:    Bilateral Near:     Physical Exam Vitals reviewed.  Constitutional:      General: She is not in acute distress.    Appearance: She is not ill-appearing, toxic-appearing or diaphoretic.  HENT:     Right Ear: Tympanic membrane and ear canal normal.     Left Ear: Tympanic membrane and ear canal normal.     Nose: Congestion present.     Mouth/Throat:     Mouth: Mucous membranes are moist.     Comments: There is no tonsillar hypertrophy.  There is very mild erythema of the posterior oropharynx and some white mucus is draining in the posterior oropharynx. Eyes:     Extraocular Movements: Extraocular movements intact.     Conjunctiva/sclera: Conjunctivae normal.     Pupils: Pupils are equal, round, and reactive to light.  Cardiovascular:     Rate and Rhythm: Normal rate and regular rhythm.     Heart sounds: No murmur  heard. Pulmonary:     Effort: No respiratory distress.     Breath sounds: No stridor. No wheezing, rhonchi or rales.  Musculoskeletal:     Cervical back: Neck supple.  Lymphadenopathy:     Cervical: No cervical adenopathy.  Skin:    Capillary Refill: Capillary refill takes less than 2 seconds.     Coloration: Skin is not jaundiced or pale.  Neurological:     General: No focal deficit present.     Mental Status: She is alert and oriented to person, place, and time.  Psychiatric:        Behavior: Behavior normal.      UC Treatments / Results  Labs (all labs ordered are listed, but only abnormal results are displayed) Labs Reviewed  POC SARS CORONAVIRUS 2 AG -  ED    EKG   Radiology No results found.  Procedures Procedures (including critical care time)  Medications Ordered in UC Medications - No data to display  Initial Impression / Assessment and Plan / UC Course  I have reviewed the triage vital signs and the nursing notes.  Pertinent labs & imaging results that were available during my care of the patient were reviewed by me and considered in my medical decision making (see chart for details).     COVID test is negative.  Zofran  is sent in for her nausea and Tessalon  Perles were sent in for the cough. Final Clinical Impressions(s) / UC Diagnoses   Final diagnoses:  Viral URI     Discharge Instructions      COVID test was negative  Take benzonatate  100 mg, 1 tab every 8 hours as needed for cough.  Ondansetron  dissolved in the mouth every 8 hours as needed for nausea or vomiting.  Make sure you are drinking plenty of fluids      ED Prescriptions     Medication Sig Dispense Auth. Provider   ondansetron  (ZOFRAN -ODT) 4 MG disintegrating tablet Take 1 tablet (4 mg total) by mouth every 8 (eight) hours as needed for nausea or vomiting. 10 tablet Vonna Sharlet POUR, MD   benzonatate  (TESSALON ) 100 MG capsule Take 1 capsule (100 mg total) by mouth 3  (three)  times daily as needed for cough. 21 capsule Anniston Nellums K, MD      PDMP not reviewed this encounter.   Vonna Sharlet POUR, MD 12/08/23 [phone]

## 2023-12-08 NOTE — Discharge Instructions (Signed)
 COVID test was negative  Take benzonatate  100 mg, 1 tab every 8 hours as needed for cough.  Ondansetron  dissolved in the mouth every 8 hours as needed for nausea or vomiting.  Make sure you are drinking plenty of fluids

## 2024-02-10 ENCOUNTER — Other Ambulatory Visit: Payer: Self-pay
